# Patient Record
Sex: Male | Born: 1965 | Race: White | Hispanic: Yes | Marital: Single | Smoking: Never smoker
Health system: Southern US, Community
[De-identification: ages and names within clinical notes are randomized; demographics above are authoritative.]

## PROBLEM LIST (undated history)

## (undated) DIAGNOSIS — K5790 Diverticulosis of intestine, part unspecified, without perforation or abscess without bleeding: Secondary | ICD-10-CM

## (undated) DIAGNOSIS — K469 Unspecified abdominal hernia without obstruction or gangrene: Secondary | ICD-10-CM

## (undated) DIAGNOSIS — K219 Gastro-esophageal reflux disease without esophagitis: Secondary | ICD-10-CM

## (undated) DIAGNOSIS — E7800 Pure hypercholesterolemia, unspecified: Secondary | ICD-10-CM

## (undated) DIAGNOSIS — M1990 Unspecified osteoarthritis, unspecified site: Secondary | ICD-10-CM

## (undated) DIAGNOSIS — E785 Hyperlipidemia, unspecified: Secondary | ICD-10-CM

## (undated) HISTORY — DX: Diverticulosis of intestine, part unspecified, without perforation or abscess without bleeding: K57.90

## (undated) HISTORY — DX: Gastro-esophageal reflux disease without esophagitis: K21.9

## (undated) HISTORY — DX: Unspecified abdominal hernia without obstruction or gangrene: K46.9

## (undated) HISTORY — DX: Hyperlipidemia, unspecified: E78.5

## (undated) HISTORY — DX: Pure hypercholesterolemia, unspecified: E78.00

## (undated) HISTORY — DX: Unspecified osteoarthritis, unspecified site: M19.90

## (undated) HISTORY — PX: CHOLECYSTECTOMY: SHX55

## (undated) HISTORY — PX: HERNIA REPAIR: SHX51

---

## 2007-09-03 ENCOUNTER — Encounter (INDEPENDENT_AMBULATORY_CARE_PROVIDER_SITE_OTHER): Payer: Self-pay | Admitting: Surgery

## 2007-09-03 ENCOUNTER — Ambulatory Visit (HOSPITAL_COMMUNITY): Admission: RE | Admit: 2007-09-03 | Discharge: 2007-09-03 | Payer: Self-pay | Admitting: Surgery

## 2010-01-20 ENCOUNTER — Emergency Department (HOSPITAL_COMMUNITY): Admission: EM | Admit: 2010-01-20 | Discharge: 2010-01-20 | Payer: Self-pay | Admitting: Family Medicine

## 2010-12-29 LAB — POCT URINALYSIS DIP (DEVICE)
Bilirubin Urine: NEGATIVE
Glucose, UA: NEGATIVE mg/dL
Protein, ur: NEGATIVE mg/dL
Specific Gravity, Urine: 1.01 (ref 1.005–1.030)
pH: 6 (ref 5.0–8.0)

## 2011-02-22 NOTE — Op Note (Signed)
NAMEOAKLYN, MANS            ACCOUNT NO.:  [id number]   MEDICAL RECORD NO.:  [id number]          PATIENT TYPE:  AMB   LOCATION:  SDS                          FACILITY:  [facility]   PHYSICIAN:  Wilmon Arms. Corliss Skains, M.D. DATE OF BIRTH:  Feb 12, 1966   DATE OF PROCEDURE:  09/03/2007  DATE OF DISCHARGE:                               OPERATIVE REPORT   PREOPERATIVE DIAGNOSIS:  Chronic cholecystitis.   POSTOPERATIVE DIAGNOSIS:  Chronic cholecystitis.   PROCEDURE PERFORMED:  Laparoscopic cholecystectomy with intraoperative  cholangiogram.   SURGEON:  Wilmon Arms. Corliss Skains, M.D., FACS   ANESTHESIA:  General endotracheal.   INDICATIONS:  The patient is a 45 year old male who presents with a one-  year history of right upper quadrant pain radiating around to his back.  This is associated with nausea and bloating.  The patient has had  symptoms in the past and had a HIDA scan from October 2006.  This showed  a gallbladder ejection fraction measuring only 8%.  It does not appear  that he has ever had an ultrasound to evaluate his gallbladder for  stones.  Apparently he was previously evaluated in [facility] for  possible cholecystectomy but it is unclear why this never happened.  He  now presents for elective cholecystectomy.   DESCRIPTION OF PROCEDURE:  The patient is brought to the operating room  and placed in the supine position on the operating room table.  After an  adequate level of general anesthesia was obtained, the patient's abdomen  was prepped with Betadine and draped in sterile fashion.  Time-out was  taken to assure the proper patient and proper procedure.  The area  around his umbilicus was infiltrated with 0.25% Marcaine.  The patient  has a previous umbilical hernia repair and it is unclear whether any  mesh was involved.  We made a transverse incision above his umbilicus.  Dissection was carried down to the fascia which was opened vertically.  A stay suture of 0 Vicryl as  placed around the fascial opening.  The  peritoneal cava was bluntly entered.  The Hasson cannula was inserted  and secured with the stay suture.  Pneumoperitoneum is obtained by  insufflating CO2 maintaining maximal pressure of 15 mmHg.  The patient  was tilted in reverse Trendelenburg and tilted to his left.  A 10-mm  port was placed in the subxiphoid position.  Two 5 mm ports were placed  in right upper quadrant.  We grasped the gallbladder fundus with a clamp  and elevated it.  The patient had dense adhesions to the surface of the  gallbladder.  Cautery and sharp dissection was used to dissect this away  from the surface of the gallbladder.  We were able to expose a very thin  cystic duct.  This was ligated with a clip distally.  A small opening  was created on the cystic duct.  A Cook cholangiogram catheter was then  inserted through a stab incision and threaded in the cystic duct.  This  was secured with a clip.  A cholangiogram showed a very long tiny cystic  duct.  Contrast flowed  easily into a normal caliber common bile duct  with good flow proximally and distally.  Contrast flowed easily into the  duodenum.  The catheter was then removed and the cystic duct was ligated  with clipped and divided.  A small cystic artery was also ligated with  clips and divided.  Cautery was used to dissect the gallbladder free  from the liver bed.  Some bile was spilled but no stones were ever  noted.  We then thoroughly irrigated the right upper quadrant with  saline.  The gallbladder was placed in an EndoCatch sac.  Hemostasis was  obtained in the gallbladder fossa with cautery.  Two pieces of Surgicel  were placed in the gallbladder fossa.  We suctioned out as much  irrigation as possible.  The gallbladder was then removed from the  umbilical port site.  Pneumoperitoneum was released as the ports were  removed.  The stay  sutures used to close the umbilical port site.  4-0 Monocryl was used to   close skin incisions.  Steri-Strips and clean dressings were applied.  The patient was then extubated and brought to recovery in stable  condition.  All sponge, instrument, and needle counts were correct.      Wilmon Arms. Tsuei, M.D.  Electronically Signed     MKT/MEDQ  D:  09/03/2007  T:  09/03/2007  Job:  [id number]

## 2011-07-19 LAB — CBC
HCT: 44.6
MCHC: 34.7
MCV: 89.5

## 2011-07-19 LAB — DIFFERENTIAL
Basophils Absolute: 0.1
Eosinophils Absolute: 0.2
Eosinophils Relative: 3
Lymphs Abs: 2.2
Monocytes Absolute: 0.6
Monocytes Relative: 8
Neutro Abs: 4.2
Neutrophils Relative %: 58

## 2011-07-19 LAB — LIPID PANEL
Cholesterol: 225 — ABNORMAL HIGH
HDL: 36 — ABNORMAL LOW
LDL Cholesterol: 124 — ABNORMAL HIGH
Total CHOL/HDL Ratio: 6.3
Triglycerides: 323 — ABNORMAL HIGH
VLDL: 65 — ABNORMAL HIGH

## 2011-07-19 LAB — COMPREHENSIVE METABOLIC PANEL
AST: 29
GFR calc Af Amer: >60
GFR calc non Af Amer: >60
Glucose, Bld: 86
Potassium: 4.3
Sodium: 140
Total Bilirubin: 0.7

## 2013-01-09 ENCOUNTER — Encounter: Payer: Self-pay | Admitting: Internal Medicine

## 2013-01-11 ENCOUNTER — Encounter: Payer: Self-pay | Admitting: Internal Medicine

## 2013-01-11 ENCOUNTER — Ambulatory Visit (INDEPENDENT_AMBULATORY_CARE_PROVIDER_SITE_OTHER): Payer: BC Managed Care – PPO | Admitting: Internal Medicine

## 2013-01-11 VITALS — BP 102/78 | HR 68 | Ht 69.0 in | Wt 197.0 lb

## 2013-01-11 DIAGNOSIS — R1013 Epigastric pain: Secondary | ICD-10-CM

## 2013-01-11 DIAGNOSIS — K219 Gastro-esophageal reflux disease without esophagitis: Secondary | ICD-10-CM

## 2013-01-11 DIAGNOSIS — R110 Nausea: Secondary | ICD-10-CM

## 2013-01-11 DIAGNOSIS — K921 Melena: Secondary | ICD-10-CM

## 2013-01-11 DIAGNOSIS — K3189 Other diseases of stomach and duodenum: Secondary | ICD-10-CM

## 2013-01-11 MED ORDER — ESOMEPRAZOLE MAGNESIUM 40 MG PO CPDR: 40.0000 mg | DELAYED_RELEASE_CAPSULE | Freq: Every day | ORAL | Status: DC

## 2013-01-11 MED ORDER — PEG-KCL-NACL-NASULF-NA ASC-C 100 G PO SOLR: 1.0000 | Freq: Once | ORAL | Status: DC

## 2013-01-11 MED ORDER — ONDANSETRON 4 MG PO TBDP: 4.0000 mg | ORAL_TABLET | Freq: Three times a day (TID) | ORAL | Status: DC | PRN

## 2013-01-11 NOTE — Patient Instructions (Addendum)
You have been scheduled for a colonoscopy/endoscopy with propofol. Please follow written instructions given to you at your visit today.  Please pick up your prep kit at the pharmacy within the next 1-3 days. If you use inhalers (even only as needed), please bring them with you on the day of your procedure.   We have sent the following medications to your pharmacy for you to pick up at your convenience: moviprep, you were given instructions today at your office visit.  Zofran, nexium                                               We are excited to introduce MyChart, a new best-in-class service that provides you online access to important information in your electronic medical record. We want to make it easier for you to view your health information - all in one secure location - when and where you need it. We expect MyChart will enhance the quality of care and service we provide.  When you register for MyChart, you can:    View your test results.    Request appointments and receive appointment reminders via email.    Request medication renewals.    View your medical history, allergies, medications and immunizations.    Communicate with your physician's office through a password-protected site.    Conveniently print information such as your medication lists.  To find out if MyChart is right for you, please talk to a member of our clinical staff today. We will gladly answer your questions about this free health and wellness tool.  If you are age 25 or older and want a member of your family to have access to your record, you must provide written consent by completing a proxy form available at our office. Please speak to our clinical staff about guidelines regarding accounts for patients younger than age 32.  As you activate your MyChart account and need any technical assistance, please call the MyChart technical support line at ([phone]-CHART [phone]) or email your question to  [email or web address]. If you email your question(s), please include your name, a return phone number and the best time to reach you.  If you have non-urgent health-related questions, you can send a message to our office through MyChart at [email or web address]. If you have a medical emergency, call 911.  Thank you for using MyChart as your new health and wellness resource!   MyChart licensed from [employer],  4540-9811. Patents Pending.

## 2013-01-11 NOTE — Progress Notes (Signed)
Patient ID: Jahlon Baines, male   DOB: 11/23/1965, 47 y.o.   MRN: [id number] HPI: Mr. Peine is a 47 yo male with PMH of long-standing GERD, hyperlipidemia who is seen in consultation at the request of Dr. Andi Devon for evaluation of upper abd pain, GERD.  He is accompanied today by medical interpreter. He reports long-standing, approximately 10 year history of gastroesophageal reflux disease. This is manifested by heartburn and he has been on multiple PPIs in the past. He is currently taking omeprazole 20 mg once daily. He was previously on Nexium 40 mg daily and this works better for him, but he was told he needs to complete his current prescription before starting Nexium which was written by his PCP. His symptoms recently seem to have been worsening including early morning nausea and occasional vomiting. He continues to have regurgitation and reflux of acid into his mouth with a sour taste. He reports decreased appetite and occasional right upper quadrant abdominal pain. Occasionally the pain seems to radiate to his back. He reports significant abdominal bloating and fullness. Bowel habits have changed slightly he is now having 2-3 stools per day which feel incomplete. Previously he was having one formed bowel movement with the feeling of complete evacuation. He has seen some red blood in his stool of late. He also describes black stools though not currently. He denies fevers or chills. No weight loss. Positive early satiety. No previous history of known H. Pylori.  No prior endoscopy.  He has had prior cholecystectomy  Past Medical History  Diagnosis Date  . GERD (gastroesophageal reflux disease)   . Hyperlipidemia   . Arthritis   . Hernia     Past Surgical History  Procedure Laterality Date  . Hernia repair    . Cholecystectomy      Current Outpatient Prescriptions  Medication Sig Dispense Refill  . esomeprazole (NEXIUM) 40 MG capsule Take 1 capsule (40 mg total) by mouth daily before  breakfast.  30 capsule  6  . ondansetron (ZOFRAN ODT) 4 MG disintegrating tablet Take 1 tablet (4 mg total) by mouth every 8 (eight) hours as needed for nausea.  20 tablet  0  . peg 3350 powder (MOVIPREP) 100 G SOLR Take 1 kit (100 g total) by mouth once.  1 kit  0   No current facility-administered medications for this visit.    No Known Allergies  Family History  Problem Relation Age of Onset  . Diabetes Maternal Aunt   . Diabetes Maternal Uncle   . Heart disease Paternal Aunt   . Liver cancer Paternal Grandfather   . Pancreatic cancer Paternal Grandmother   . Kidney disease Father   . Irritable bowel syndrome Brother   . Clotting disorder Maternal Grandmother     History   Social History  . Marital Status: Single    Spouse Name: N/A    Number of Children: 0  . Years of Education: N/A   Occupational History  .     Social History Main Topics  . Smoking status: Never Smoker   . Smokeless tobacco: Never Used  . Alcohol Use: Yes     Comment: 2 drinks per day  . Drug Use: No  . Sexually Active: None   Other Topics Concern  . None   Social History Narrative  . None    ROS: As per history of present illness, otherwise negative  BP 102/78  Pulse 68  Ht 5\' 9"  (1.753 m)  Wt 197 lb (89.359 kg)  BMI 29.08 kg/m2 Constitutional: Well-developed and well-nourished. No distress. HEENT: Normocephalic and atraumatic. Oropharynx is clear and moist. No oropharyngeal exudate. Conjunctivae are normal.  No scleral icterus. Neck: Neck supple. Trachea midline. Cardiovascular: Normal rate, regular rhythm and intact distal pulses. No M/R/G Pulmonary/chest: Effort normal and breath sounds normal. No wheezing, rales or rhonchi. Abdominal: Soft, nontender, nondistended. Bowel sounds active throughout. There are no masses palpable. No hepatosplenomegaly. Extremities: no clubbing, cyanosis, or edema Lymphadenopathy: No cervical adenopathy noted. Neurological: Alert and oriented to  person place and time. Skin: Skin is warm and dry. No rashes noted. Psychiatric: Normal mood and affect. Behavior is normal.  RELEVANT LABS AND IMAGING: Labs dated 12/25/2012 H. pylori IgG negative  Labs dated 12/01/2012 WBC 6.7, hemoglobin 16.7, hematocrit 50.2, MCV 92.6, platelet 238 CMP within normal limits  ASSESSMENT/PLAN: 47 yo male with PMH of long-standing GERD, hyperlipidemia who is seen in consultation at the request of Dr. Andi Devon for evaluation of upper abd pain, GERD.  1.  Dyspepsia/GERD/nausea -- his upper GI symptoms have not completely respond to PPI and therefore I recommended upper endoscopy. This will help rule out as of esophagitis, and also peptic ulcer disease. We can plan biopsies for H. pylori given the decreased sensitivity of stool antigen and breath test on PPI.  I will have him switch to Nexium 40 mg daily now given it worked better for him. He is instructed to take this at least 30 minutes before breakfast. I will prescribe ondansetron to be used as needed and as directed for nausea.  2.  Red blood per rectum -- given his change in bowel habits and red blood in stool, we will perform colonoscopy at the same time as his upper endoscopy. We discussed the test today including the risks and benefits and he is agreeable to proceed.  Her the recommendations after procedure.

## 2013-03-01 ENCOUNTER — Ambulatory Visit (AMBULATORY_SURGERY_CENTER): Payer: BC Managed Care – PPO | Admitting: Internal Medicine

## 2013-03-01 ENCOUNTER — Encounter: Payer: Self-pay | Admitting: Internal Medicine

## 2013-03-01 VITALS — BP 131/68 | HR 50 | Temp 97.9°F | Resp 19 | Ht 69.0 in | Wt 197.0 lb

## 2013-03-01 DIAGNOSIS — D126 Benign neoplasm of colon, unspecified: Secondary | ICD-10-CM

## 2013-03-01 DIAGNOSIS — R1013 Epigastric pain: Secondary | ICD-10-CM

## 2013-03-01 DIAGNOSIS — K2970 Gastritis, unspecified, without bleeding: Secondary | ICD-10-CM

## 2013-03-01 DIAGNOSIS — K219 Gastro-esophageal reflux disease without esophagitis: Secondary | ICD-10-CM

## 2013-03-01 DIAGNOSIS — K2990 Gastroduodenitis, unspecified, without bleeding: Secondary | ICD-10-CM

## 2013-03-01 DIAGNOSIS — K921 Melena: Secondary | ICD-10-CM

## 2013-03-01 MED ORDER — SODIUM CHLORIDE 0.9 % IV SOLN: 500.0000 mL | INTRAVENOUS | Status: DC

## 2013-03-01 NOTE — Patient Instructions (Addendum)

## 2013-03-01 NOTE — Progress Notes (Signed)
Pt. With interpreter and pt. Spouse throughtout recovery period.  Instructions given in Spanish and in English with interpreterer going over material with pt.FOLLOW DISCHARGE INSTRUCTIONS   Patient did not experience any of the following events: a burn prior to discharge; a fall within the facility; wrong site/side/patient/procedure/implant event; or a hospital transfer or hospital admission upon discharge from the facility. (G8907)Patient did not have preoperative order for IV antibiotic SSI prophylaxis. [phone]).

## 2013-03-01 NOTE — Op Note (Signed)
[facility] [address] [place], [address]   ENDOSCOPY PROCEDURE REPORT  Dean: Yazid, Pop  MR#: [id number] BIRTHDATE: 24-Aug-1966 , 46  yrs. old GENDER: Male ENDOSCOPIST: Beverley Fiedler, MD REFERRED BY:  Gabriel Earing, M.D. PROCEDURE DATE:  03/01/2013 PROCEDURE:  EGD w/ biopsy ASA CLASS:     Class II INDICATIONS:  Dyspepsia.   Nausea.   history of esophageal reflux. MEDICATIONS: MAC sedation, administered by CRNA and propofol (Diprivan) 300mg  IV TOPICAL ANESTHETIC: Cetacaine Spray  DESCRIPTION OF PROCEDURE: After James risks benefits and alternatives of James procedure were thoroughly explained, informed consent was obtained.  James LB [id number] endoscope was introduced through James mouth and advanced to James second portion of James duodenum. Without limitations.  James instrument was slowly withdrawn as James mucosa was fully examined.    ESOPHAGUS: An irregular Z-line was observed 39 cm from James incisors. Multiple biopsies were performed using cold forceps.  Sample obtained to rule out Barrett's esophagus.   James esophagus was otherwise normal.  STOMACH: James mucosa of James stomach appeared normal.  Biopsies were taken in James antrum and angularis.  DUODENUM: James duodenal mucosa showed no abnormalities in James bulb and second portion of James duodenum.  Retroflexed views revealed no abnormalities.     James scope was then withdrawn from James Dean and James procedure completed.  COMPLICATIONS: There were no complications. ENDOSCOPIC IMPRESSION: 1.   Irregular Z-line was observed 39 cm from James incisors; multiple biopsies 2.   James esophagus was otherwise normal. 3.   James mucosa of James stomach appeared normal; biopsies were taken in James antrum and angularis 4.   James duodenal mucosa showed no abnormalities in James bulb and second portion of James duodenum  RECOMMENDATIONS: 1.  Await biopsy results 2.  Continue taking your PPI (Nexium) once daily.  It is best  to be taken 20-30 minutes prior to breakfast meal. 3.  Follow-up of helicobacter pylori status, treat if indicated 4.  Office follow-up in about 1 month  eSigned:  Beverley Fiedler, MD 03/01/2013 2:18 PM CC:James Dean

## 2013-03-01 NOTE — Progress Notes (Signed)
Called to room to assist during endoscopic procedure.  Patient ID and intended procedure confirmed with present staff. Received instructions for my participation in the procedure from the performing physician.  

## 2013-03-01 NOTE — Op Note (Signed)
[facility] [address] [place], [address]   COLONOSCOPY PROCEDURE REPORT  PATIENT: James Dean, James Dean  MR#: [id number] BIRTHDATE: 12/25/1965 , 46  yrs. old GENDER: Male ENDOSCOPIST: Beverley Fiedler, MD REFERRED XL:KGMWN Cloward, M.D. PROCEDURE DATE:  03/01/2013 PROCEDURE:   Colonoscopy with snare polypectomy ASA CLASS:   Class II INDICATIONS:Rectal Bleeding. MEDICATIONS: MAC sedation, administered by CRNA and propofol (Diprivan) 200mg  IV  DESCRIPTION OF PROCEDURE:   After the risks benefits and alternatives of the procedure were thoroughly explained, informed consent was obtained.  A digital rectal exam revealed external hemorrhoids.   The LB [id number]  endoscope was introduced through the anus and advanced to the cecum, which was identified by both the appendix and ileocecal valve. No adverse events experienced.   The quality of the prep was good, using MoviPrep The instrument was then slowly withdrawn as the colon was fully examined.  COLON FINDINGS: Two sessile polyps measuring 3 mm and 6 mm mm in size were found in the ascending colon.  Polypectomy was performed using cold snare.  All resections were complete and all polyp tissue was completely retrieved.   Mild diverticulosis was noted in the descending colon and sigmoid colon.  Retroflexed views revealed small internal and external hemorrhoids. The time to cecum=1 minutes 39 seconds.  Withdrawal time=11 minutes 49 seconds.  The scope was withdrawn and the procedure completed. COMPLICATIONS: There were no complications.  ENDOSCOPIC IMPRESSION: 1.   Two sessile polyps measuring 3 mm and 6 mm mm in size were found in the ascending colon; Polypectomy was performed using cold snare 2.   Mild diverticulosis was noted in the descending colon and sigmoid colon 3.   Small internal and external hemorrhoids  RECOMMENDATIONS: 1.  Hold aspirin, aspirin products, and anti-inflammatory medication for 1  week. 2.  Await pathology results 3.  High fiber diet 4.  If the polyps removed today are proven to be adenomatous (pre-cancerous) polyps, you will need a repeat colonoscopy in 5 years.  Otherwise you should continue to follow colorectal cancer screening guidelines for "routine risk" patients with colonoscopy in 10 years.  You will receive a letter within 1-2 weeks with the results of your biopsy as well as final recommendations.  Please call my office if you have not received a letter after 3 weeks.   eSigned:  Beverley Fiedler, MD 03/01/2013 2:22 PM            cc: The Patient

## 2013-03-05 ENCOUNTER — Telehealth: Payer: Self-pay | Admitting: *Deleted

## 2013-03-05 NOTE — Telephone Encounter (Signed)
Number identifier, left message, follow-up  

## 2013-03-07 ENCOUNTER — Encounter: Payer: Self-pay | Admitting: Internal Medicine

## 2013-03-07 ENCOUNTER — Other Ambulatory Visit: Payer: Self-pay | Admitting: *Deleted

## 2013-03-26 ENCOUNTER — Encounter: Payer: Self-pay | Admitting: Internal Medicine

## 2013-03-29 ENCOUNTER — Ambulatory Visit (INDEPENDENT_AMBULATORY_CARE_PROVIDER_SITE_OTHER): Payer: BC Managed Care – PPO | Admitting: Internal Medicine

## 2013-03-29 ENCOUNTER — Encounter: Payer: Self-pay | Admitting: Internal Medicine

## 2013-03-29 VITALS — BP 122/70 | HR 80 | Ht 68.0 in | Wt 201.5 lb

## 2013-03-29 DIAGNOSIS — K921 Melena: Secondary | ICD-10-CM

## 2013-03-29 DIAGNOSIS — R1013 Epigastric pain: Secondary | ICD-10-CM

## 2013-03-29 DIAGNOSIS — K648 Other hemorrhoids: Secondary | ICD-10-CM

## 2013-03-29 DIAGNOSIS — Z860101 Personal history of adenomatous and serrated colon polyps: Secondary | ICD-10-CM | POA: Insufficient documentation

## 2013-03-29 DIAGNOSIS — K219 Gastro-esophageal reflux disease without esophagitis: Secondary | ICD-10-CM

## 2013-03-29 DIAGNOSIS — K649 Unspecified hemorrhoids: Secondary | ICD-10-CM

## 2013-03-29 DIAGNOSIS — Z86010 Personal history of colonic polyps: Secondary | ICD-10-CM

## 2013-03-29 MED ORDER — RESTORA PO CAPS: 1.0000 | ORAL_CAPSULE | Freq: Every day | ORAL | Status: DC

## 2013-03-29 MED ORDER — ESOMEPRAZOLE MAGNESIUM 40 MG PO CPDR: 40.0000 mg | DELAYED_RELEASE_CAPSULE | Freq: Every day | ORAL | Status: DC

## 2013-03-29 MED ORDER — HYDROCORTISONE ACETATE 25 MG RE SUPP: 25.0000 mg | RECTAL | Status: DC | PRN

## 2013-03-29 NOTE — Patient Instructions (Addendum)
We have given you samples of Restora. This puts good bacteria back into your intestines. You should take 1 capsule by mouth once daily. If this works well for you, it can be purchased over the counter.or online  Continue taking Nexium  We have sent the following medications to your pharmacy for you to pick up at your convenience: [facility]; use as needed for Internal Hemorrhoids                                               We are excited to introduce MyChart, a new best-in-class service that provides you online access to important information in your electronic medical record. We want to make it easier for you to view your health information - all in one secure location - when and where you need it. We expect MyChart will enhance the quality of care and service we provide.  When you register for MyChart, you can:    View your test results.    Request appointments and receive appointment reminders via email.    Request medication renewals.    View your medical history, allergies, medications and immunizations.    Communicate with your physician's office through a password-protected site.    Conveniently print information such as your medication lists.  To find out if MyChart is right for you, please talk to a member of our clinical staff today. We will gladly answer your questions about this free health and wellness tool.  If you are age 50 or older and want a member of your family to have access to your record, you must provide written consent by completing a proxy form available at our office. Please speak to our clinical staff about guidelines regarding accounts for patients younger than age 73.  As you activate your MyChart account and need any technical assistance, please call the MyChart technical support line at [phone]) or email your question to [email or web address]. If you email your question(s), please include your name, a return phone number and the  best time to reach you.  If you have non-urgent health-related questions, you can send a message to our office through MyChart at [email or web address]. If you have a medical emergency, call 911.  Thank you for using MyChart as your new health and wellness resource!   MyChart licensed from [employer],  4540-9811. Patents Pending.

## 2013-03-29 NOTE — Progress Notes (Signed)
Subjective:    Patient ID: James Dean, male    DOB: 10/05/1966, 47 y.o.   MRN: [id number]  HPI James Dean is a 47 yo male with PMH of long-standing GERD, hyperlipidemia who is seen in followup. He had an upper endoscopy performed for dyspepsia nausea and history of reflux on 03/01/2013. This showed an irregular Z line which was biopsied, otherwise normal esophagus, normal stomach, normal duodenum. Biopsies from the stomach showed slight chronic gastritis without H. pylori, dysplasia or malignancy. GE junction biopsies were consistent with gastroesophageal reflux without intestinal metaplasia,  Dysplasia or malignancy. Colonoscopy performed on the same day for rectal bleeding revealed 2 sessile polyps in the ascending colon, mild left-sided diverticulosis and small internal and external hemorrhoids. The polyps were found to be adenomatous without high-grade dysplasia.  After his last visit his PPI was changed from omeprazole to Nexium and he is here for followup. He reports he is feeling better. His GERD significantly improved with the change to Nexium. He is still having occasional abdominal bloating and gas. This is postprandial in nature. He reports he is eating well without nausea or vomiting. Bowel habits have changed slightly since colonoscopy and his stools are slightly more loose. No melena. He does report occasional bright red blood on the toilet tissue. He also reports occasional upper abdominal discomfort after using a piece of heavy machinery at work which vibrates his whole body. Outside of using this machine he has no abdominal pain.  Review of Systems As per history of present illness, otherwise negative  Current Medications, Allergies, Past Medical History, Past Surgical History, Family History and Social History were reviewed in [employer] record.     Objective:   Physical Exam BP 122/70  Pulse 80  Ht 5\' 8"  (1.727 m)  Wt 201 lb 8 oz (91.4 kg)  BMI  30.65 kg/m2 Constitutional: Well-developed and well-nourished. No distress. HEENT: Normocephalic and atraumatic. Oropharynx is clear and moist. No oropharyngeal exudate. Conjunctivae are normal.  No scleral icterus. Cardiovascular: Normal rate, regular rhythm and intact distal pulses. No M/R/G Pulmonary/chest: Effort normal and breath sounds normal. No wheezing, rales or rhonchi. Abdominal: Soft, nontender, nondistended. Bowel sounds active throughout. There are no masses palpable. No hepatosplenomegaly. Extremities: no clubbing, cyanosis, or edema Neurological: Alert and oriented to person place and time. Skin: Skin is warm and dry. No rashes noted. Psychiatric: Normal mood and affect. Behavior is normal.    Assessment & Plan:  47 yo male with PMH of long-standing GERD, hyperlipidemia who is seen in followup. He had an upper endoscopy performed for dyspepsia nausea and history of reflux on 03/01/2013.   1.  GERD/dyspepsia -- better controlled with changing PPI to Nexium. He will continue Nexium 40 mg daily. Upper endoscopy reassuring with no evidence of Barrett's or H. pylori. A GERD diet was recommended.  2.  Abdominal bloating -- I recommended a probiotic and he was given samples of Restora to help restore balance and hopefully provide regulation indigestion. If this fails to improve his symptoms he is asked to notify my office. She voices understanding.  3.  Hemorrhoids -- he is having some small volume rectal bleeding which is felt to be secondary to hemorrhoids he recently colonoscopy. I recommended and prescribed Anusol-HC suppositories to be used as needed as directed for such bleeding. If this fails to improve his symptoms or the bleeding becomes more voluminous he is asked to let us know. He voices understanding  4.  History of adenomatous colon  polyps -- 2 subcentimeter adenomas removed recommend repeat in 5 years. He is aware of this recommendation

## 2015-08-19 ENCOUNTER — Ambulatory Visit: Payer: Self-pay | Admitting: Family Medicine

## 2015-11-02 ENCOUNTER — Ambulatory Visit
Admission: RE | Admit: 2015-11-02 | Discharge: 2015-11-02 | Disposition: A | Discharge status: Home / Self Care | Payer: BLUE CROSS/BLUE SHIELD | Source: Ambulatory Visit | Attending: Physician Assistant | Admitting: Physician Assistant

## 2015-11-02 ENCOUNTER — Other Ambulatory Visit: Payer: Self-pay | Admitting: Physician Assistant

## 2015-11-02 DIAGNOSIS — M549 Dorsalgia, unspecified: Secondary | ICD-10-CM

## 2015-11-02 DIAGNOSIS — M79605 Pain in left leg: Secondary | ICD-10-CM

## 2015-12-07 ENCOUNTER — Other Ambulatory Visit: Payer: Self-pay | Admitting: Family Medicine

## 2015-12-07 DIAGNOSIS — G5712 Meralgia paresthetica, left lower limb: Secondary | ICD-10-CM

## 2015-12-07 DIAGNOSIS — R42 Dizziness and giddiness: Secondary | ICD-10-CM

## 2015-12-07 DIAGNOSIS — R41840 Attention and concentration deficit: Secondary | ICD-10-CM

## 2015-12-14 ENCOUNTER — Ambulatory Visit
Admission: RE | Admit: 2015-12-14 | Discharge: 2015-12-14 | Disposition: A | Discharge status: Home / Self Care | Payer: BLUE CROSS/BLUE SHIELD | Source: Ambulatory Visit | Attending: Family Medicine | Admitting: Family Medicine

## 2015-12-14 DIAGNOSIS — G5712 Meralgia paresthetica, left lower limb: Secondary | ICD-10-CM

## 2015-12-14 DIAGNOSIS — R42 Dizziness and giddiness: Secondary | ICD-10-CM

## 2015-12-14 DIAGNOSIS — R41840 Attention and concentration deficit: Secondary | ICD-10-CM

## 2015-12-14 MED ORDER — IOPAMIDOL (ISOVUE-300) INJECTION 61%
75.0000 mL | Freq: Once | INTRAVENOUS | Status: AC | PRN
  Administered 2015-12-14: 75 mL via INTRAVENOUS

## 2016-03-11 ENCOUNTER — Ambulatory Visit (INDEPENDENT_AMBULATORY_CARE_PROVIDER_SITE_OTHER): Payer: BLUE CROSS/BLUE SHIELD | Admitting: Neurology

## 2016-03-11 ENCOUNTER — Encounter: Payer: Self-pay | Admitting: Neurology

## 2016-03-11 VITALS — BP 132/76 | HR 60 | Ht 68.0 in | Wt 186.0 lb

## 2016-03-11 DIAGNOSIS — R42 Dizziness and giddiness: Secondary | ICD-10-CM

## 2016-03-11 DIAGNOSIS — R202 Paresthesia of skin: Secondary | ICD-10-CM

## 2016-03-11 MED ORDER — ACETAZOLAMIDE 125 MG PO TABS: ORAL_TABLET | ORAL | Status: DC

## 2016-03-11 NOTE — Patient Instructions (Signed)
Vértigo  (Vertigo)   Vértigo es la sensación de que se está moviendo estando quieto. Puede ser peligroso si ocurre cuando está trabajado, conduciendo vehículos o realizando actividades difíciles.   CAUSAS   El vértigo se produce cuando hay un conflicto en las señales que se envían al cerebro desde los sistemas visual y sensorial del cuerpo. Hay numerosas causas que originan este problema, entre las que se incluyen:   · Infecciones, especialmente en el oído interno.  · Una mala reacción a un medicamento o mal uso de alcohol y fármacos.  · Abstinencia de drogas o alcohol.  · Cambios rápidos de posición, como al acostarse o darse vuelta en la cama.  · Dolor de cabeza migrañoso.  · Disminución del flujo sanguíneo hacia el cerebro.  · Aumento de la presión en el cerebro por un traumatismo, infección, tumor o sangrado en la cabeza.  SÍNTOMAS   Puede sentir como si el mundo da vueltas o va a caer al piso. Como hay problemas en el equilibrio, el vértigo puede causar náuseas y vómitos. Tiene movimientos oculares involuntarios (nistagmus).   DIAGNÓSTICO   El vértigo normalmente se diagnostica con un examen físico. Si la causa no se conoce, el médico puede indicar diagnóstico por imágenes, como una resonancia magnética (imágenes por resonancia magnética).   TRATAMIENTO   La mayor parte de los casos de vértigo se resuelve sin tratamiento. Según la causa, el médico podrá recetar ciertos medicamentos. Si se relaciona con la posición del cuerpo, podrá recomendarle movimientos o procedimientos para corregir el problema. En algunos casos raros, si la causa del vértigo es un problema en el oído interno, necesitará una cirugía.   INSTRUCCIONES PARA EL CUIDADO DOMICILIARIO  · Siga las indicaciones del médico.  · Evite conducir vehículos.  · Evite operar maquinarias pesadas.  · Evite realizar tareas que serían peligrosas para usted u otras personas durante un episodio de vértigo.  · Comuníquele al médico si nota que ciertos medicamentos  parecen asociarse con las crisis. Algunos medicamentos que se usan para tratar los episodios, en algunas personas los empeoran.  SOLICITE ATENCIÓN MÉDICA DE INMEDIATO SI:  · Los medicamentos no alivian las crisis o hacen que estas empeoren.  · Tiene dificultad para hablar, caminar, siente debilidad o tiene problemas para usar los brazos, las manos o las piernas.  · Comienza a sufrir un dolor de cabeza intenso.  · Las náuseas y los vómitos no se alivian o se agravan.  · Aparecen trastornos visuales.  · Un miembro de su familia nota cambios en su conducta.  · Hay alguna modificación en su trastorno que parece hacerlo empeorar en lugar de mejorar.  ASEGÚRESE DE QUE:   · Comprende estas instrucciones.  · Controlará su enfermedad.  · Solicitará ayuda de inmediato si no mejora o si empeora.     Esta información no tiene como fin reemplazar el consejo del médico. Asegúrese de hacerle al médico cualquier pregunta que tenga.     Document Released: 07/06/2005 Document Revised: 12/19/2011  Elsevier Interactive Patient Education ©2016 [employer]

## 2016-03-11 NOTE — Progress Notes (Signed)
Reason for visit: Vertigo  Referring physician: Dr. Tery Sanfilippo James Dean is a 50 y.o. male  History of present illness:  James Dean is a 50 year old right-handed Hispanic male with a several year history of episodes of dizziness or vertigo. The patient comes in with an interpreter today. The patient indicates that the episodes have become more frequent over time, he is now having 2 or 3 episodes a week. Initially, the episodes of dizziness would last only a few seconds, but now episodes may last 10-15 minutes. The episodes are associated with bilateral hearing loss, some discomfort in the back of the neck and head, and some confusion. The patient has difficulty with focusing during the events. The patient may occasionally become nauseated with the events, but with no vomiting. He does not have an overt headache associated with the episodes. The patient has persistent numbness in the distal anterolateral left thigh, he has had some numbness in the fifth finger on the left hand, but this has resolved. The patient does report some difficulty with memory that has been present for several years, gradually worsening over time. He denies any balance issues or difficulty controlling the bowels or the bladder. The episodes of dizziness may be initiated by a head movement such as bending or stooping, sitting up when he first gets up in the morning, or with standing. The patient has not had any blackouts. He has difficulty performing his job tasks during the dizzy events. The patient has undergone a CT scan of the head that was done, this appears to be normal. He comes to this office for further evaluation.  Past Medical History  Diagnosis Date  . GERD (gastroesophageal reflux disease)   . Hyperlipidemia   . Arthritis   . Hernia   . Diverticulosis   . High cholesterol     Past Surgical History  Procedure Laterality Date  . Hernia repair    . Cholecystectomy      Family History  Problem  Relation Age of Onset  . Diabetes Maternal Aunt   . Diabetes Maternal Uncle   . Heart disease Paternal Aunt   . Pancreatic cancer Paternal Grandmother   . Liver cancer Paternal Grandmother   . Kidney disease Father   . Irritable bowel syndrome Brother   . Clotting disorder Maternal Grandmother     Social history:  reports that he has never smoked. He has never used smokeless tobacco. He reports that he drinks alcohol. He reports that he does not use illicit drugs.  Medications:  Prior to Admission medications   Medication Sig Start Date End Date Taking? Authorizing Provider  esomeprazole (NEXIUM) 40 MG capsule Take 1 capsule (40 mg total) by mouth daily before breakfast. 03/29/13  Yes Jerene Bears, MD  loratadine (CLARITIN) 10 MG tablet Take 10 mg by mouth daily.   Yes Historical Provider, MD  hydrocortisone (ANUSOL-HC) 25 MG suppository Place 1 suppository (25 mg total) rectally as needed for hemorrhoids. Patient not taking: Reported on 03/11/2016 03/29/13   Jerene Bears, MD  ondansetron (ZOFRAN ODT) 4 MG disintegrating tablet Take 1 tablet (4 mg total) by mouth every 8 (eight) hours as needed for nausea. Patient not taking: Reported on 03/11/2016 01/11/13   Jerene Bears, MD     No Known Allergies  ROS:  Out of a complete 14 system review of symptoms, the patient complains only of the following symptoms, and all other reviewed systems are negative.  Fatigue Hearing loss Blurred vision,  loss of vision Constipation Urination problems Joint pain, muscle cramps Runny nose Memory loss  Blood pressure 132/76, pulse 60, height 5\' 8"  (1.727 m), weight 186 lb (84.369 kg).  Physical Exam  General: The patient is alert and cooperative at the time of the examination.  Eyes: Pupils are equal, round, and reactive to light. Discs are flat bilaterally.  Ears: Tympanic membranes are clear bilaterally.  Neck: The neck is supple, no carotid bruits are noted.  Respiratory: The respiratory  examination is clear.  Cardiovascular: The cardiovascular examination reveals a regular rate and rhythm, no obvious murmurs or rubs are noted.  Skin: Extremities are without significant edema.  Neurologic Exam  Mental status: The patient is alert and oriented x 3 at the time of the examination. The patient has apparent normal recent and remote memory, with an apparently normal attention span and concentration ability. Mini-Mental Status Examination done today shows a total score 23/30.  Cranial nerves: Facial symmetry is present. There is good sensation of the face to pinprick and soft touch bilaterally. The strength of the facial muscles and the muscles to head turning and shoulder shrug are normal bilaterally. Speech is well enunciated, no aphasia or dysarthria is noted. Extraocular movements are full. Visual fields are full. The tongue is midline, and the patient has symmetric elevation of the soft palate. No obvious hearing deficits are noted.  Motor: The motor testing reveals 5 over 5 strength of all 4 extremities. Good symmetric motor tone is noted throughout.  Sensory: Sensory testing is intact to pinprick, soft touch, vibration sensation, and position sense on all 4 extremities, with exception of a small area in the distal anterolateral left thigh with decreased pinprick sensation. No evidence of extinction is noted.  Coordination: Cerebellar testing reveals good finger-nose-finger and heel-to-shin bilaterally.  Gait and station: Gait is normal. Tandem gait is normal. Romberg is negative. No drift is seen.  Reflexes: Deep tendon reflexes are symmetric and normal bilaterally. Reflexes in the legs are slightly brisk. Toes are downgoing bilaterally.   CT head 12/14/15:  IMPRESSION: 1. Normal CT appearance of the brain without and with contrast. 2. Minimal ethmoid sinus disease.  * CT scan images were reviewed online. I agree with the written report.    Assessment/Plan:  1.  Episodic dizziness, hearing changes, neck discomfort  2. Memory disturbance  The clinical examination is relatively unremarkable. The patient reports episodes of dizziness, slight confusion, neck and posterior head discomfort, and hearing changes. The possibility of Mnire's disease needs to be considered, migraine headache needs to be considered as well. The patient will be evaluated for possible demyelinating disease with MRI of the brain. He will be placed on a trial of Diamox. He will follow-up in 3-4 months. He will contact me if he is not tolerating medication well.   Jill Alexanders MD 03/11/2016 2:53 PM  [facility] [address] [place], [place] [address]  Phone [phone] Fax [phone]

## 2016-03-23 ENCOUNTER — Ambulatory Visit: Payer: BLUE CROSS/BLUE SHIELD

## 2016-04-06 ENCOUNTER — Ambulatory Visit (INDEPENDENT_AMBULATORY_CARE_PROVIDER_SITE_OTHER): Payer: BLUE CROSS/BLUE SHIELD

## 2016-04-06 DIAGNOSIS — R42 Dizziness and giddiness: Secondary | ICD-10-CM

## 2016-04-06 DIAGNOSIS — R202 Paresthesia of skin: Secondary | ICD-10-CM

## 2016-04-07 MED ORDER — GADOPENTETATE DIMEGLUMINE 469.01 MG/ML IV SOLN: 18.0000 mL | Freq: Once | INTRAVENOUS | Status: AC | PRN

## 2016-04-08 ENCOUNTER — Telehealth: Payer: Self-pay | Admitting: Neurology

## 2016-04-08 NOTE — Telephone Encounter (Signed)
   MRI the brain is essentially normal, minimal white matter changes. Minimal fluid in the maxillary sinuses.  MRI brain 04/08/16:  IMPRESSION: This MRI of the brain with and without contrast shows the following: 1. There are a few punctate T2/FLAIR hyperintense foci in the subcortical white matter of both hemispheres. This is a nonspecific finding and could be idiopathic, due to multiple chronic microvascular ischemic change or due to migraine. The foci do not appear to be acute. 2. Fluid is noted within the maxillary sinuses consistent with mild acute maxillary sinusitis. 3. There are no acute findings in the brain.

## 2016-07-14 ENCOUNTER — Encounter: Payer: Self-pay | Admitting: Neurology

## 2016-07-14 ENCOUNTER — Ambulatory Visit (INDEPENDENT_AMBULATORY_CARE_PROVIDER_SITE_OTHER): Payer: BLUE CROSS/BLUE SHIELD | Admitting: Neurology

## 2016-07-14 VITALS — BP 124/83 | HR 67 | Resp 16 | Ht 68.0 in | Wt 186.0 lb

## 2016-07-14 DIAGNOSIS — R42 Dizziness and giddiness: Secondary | ICD-10-CM | POA: Diagnosis not present

## 2016-07-14 NOTE — Progress Notes (Signed)
Reason for visit: Vertigo  James Dean is an 50 y.o. male  History of present illness:  James Dean is a 50 year old right-handed Hispanic male with a history of dizziness and vertigo that have been present for several years. The patient was having 2 to 3 episodes each week of dizziness that may last up to 10-15 minutes associated with some discomfort in the back of the head and neck and some sensation of being spacey. MRI of the brain was done, this shows no significant abnormalities, the patient has minimal fluid in the maxillary sinuses bilaterally. The patient does report sinus drainage that is clear on a daily basis, most notable early in the morning. The patient has been placed on Diamox, he cannot tolerate the medication secondary to stomach upset. However, the dizziness and headache problems have gone away. He returns for an evaluation.  Past Medical History:  Diagnosis Date  . Arthritis   . Diverticulosis   . GERD (gastroesophageal reflux disease)   . Hernia   . High cholesterol   . Hyperlipidemia     Past Surgical History:  Procedure Laterality Date  . CHOLECYSTECTOMY    . HERNIA REPAIR      Family History  Problem Relation Age of Onset  . Kidney disease Father   . Diabetes Maternal Aunt   . Diabetes Maternal Uncle   . Heart disease Paternal Aunt   . Pancreatic cancer Paternal Grandmother   . Liver cancer Paternal Grandmother   . Irritable bowel syndrome Brother   . Clotting disorder Maternal Grandmother     Social history:  reports that he has never smoked. He has never used smokeless tobacco. He reports that he drinks alcohol. He reports that he does not use drugs.   No Known Allergies  Medications:  Prior to Admission medications   Not on File    ROS:  Out of a complete 14 system review of symptoms, the patient complains only of the following symptoms, and all other reviewed systems are negative.  Dizziness Sinus drainage  Blood pressure  124/83, pulse 67, resp. rate 16, height 5\' 8"  (1.727 m), weight 186 lb (84.4 kg).  Physical Exam  General: The patient is alert and cooperative at the time of the examination.  Skin: No significant peripheral edema is noted.   Neurologic Exam  Mental status: The patient is alert and oriented x 3 at the time of the examination. The patient has apparent normal recent and remote memory, with an apparently normal attention span and concentration ability.   Cranial nerves: Facial symmetry is present. Speech is normal, no aphasia or dysarthria is noted. Extraocular movements are full. Visual fields are full.  Motor: The patient has good strength in all 4 extremities.  Sensory examination: Soft touch sensation is symmetric on the face, arms, and legs.  Coordination: The patient has good finger-nose-finger and heel-to-shin bilaterally.  Gait and station: The patient has a normal gait. Tandem gait is normal. Romberg is negative. No drift is seen.  Reflexes: Deep tendon reflexes are symmetric.   MRI brain 04/08/16:  IMPRESSION: This MRI of the brain with and without contrast shows the following: 1. There are a few punctate T2/FLAIR hyperintense foci in the subcortical white matter of both hemispheres. This is a nonspecific finding and could be idiopathic, due to multiple chronic microvascular ischemic change or due to migraine. The foci do not appear to be acute. 2. Fluid is noted within the maxillary sinuses consistent with mild acute maxillary  sinusitis. 3. There are no acute findings in the brain.  * MRI scan images were reviewed online. I agree with the written report.     Assessment/Plan:  1. Episodic vertigo, headache, possible migraine  The patient may have allergy associated sinus drainage, there is no evidence of severe sinusitis by MRI. The patient is to try a course of Claritin over the next several weeks to see if this improves a sinus drainage. His headache  issues and vertigo have stopped. If these are related to migraine, they may recur down the road. A trial on Topamax may be warranted. He will follow-up if needed.  Jill Alexanders MD 07/14/2016 7:34 AM  [facility] [address] [place], [place] [address]  Phone [phone] Fax [phone]

## 2017-02-13 IMAGING — CR DG HIP (WITH OR WITHOUT PELVIS) 3-4V BILAT
4 series · 4 of 4 positions shown · non-contrast
Comparison: Abdominal and pelvic CT scan November 06, 2014

CLINICAL DATA: Low back and left leg pain for the past 2 months
without known injury

EXAM:
DG HIP (WITH OR WITHOUT PELVIS) 3-4V BILAT

[w pelvis (1 of 2)]
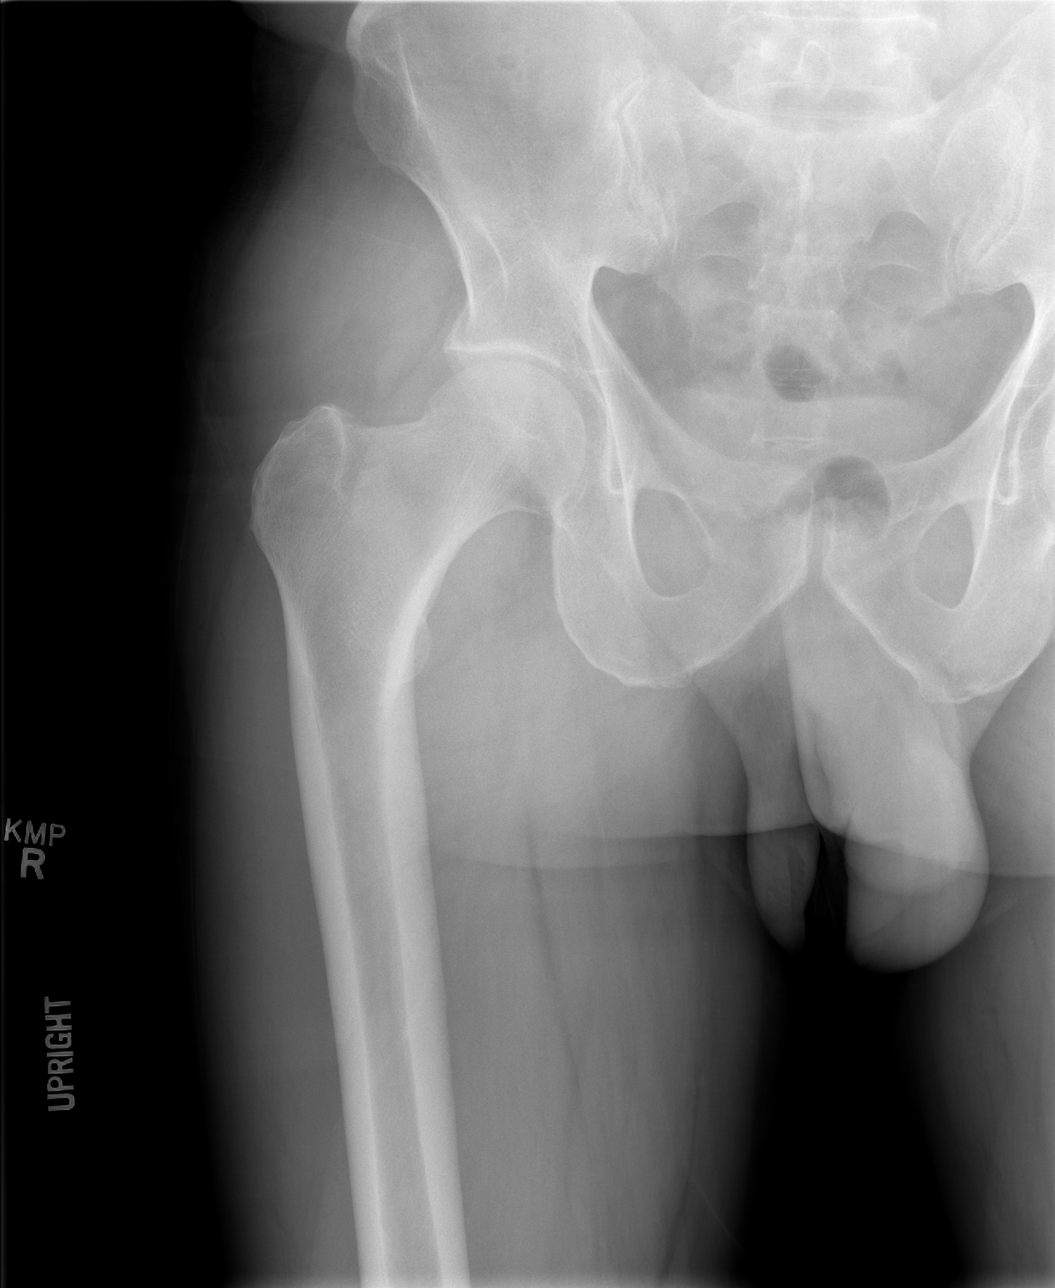

[w pelvis (2 of 2)]
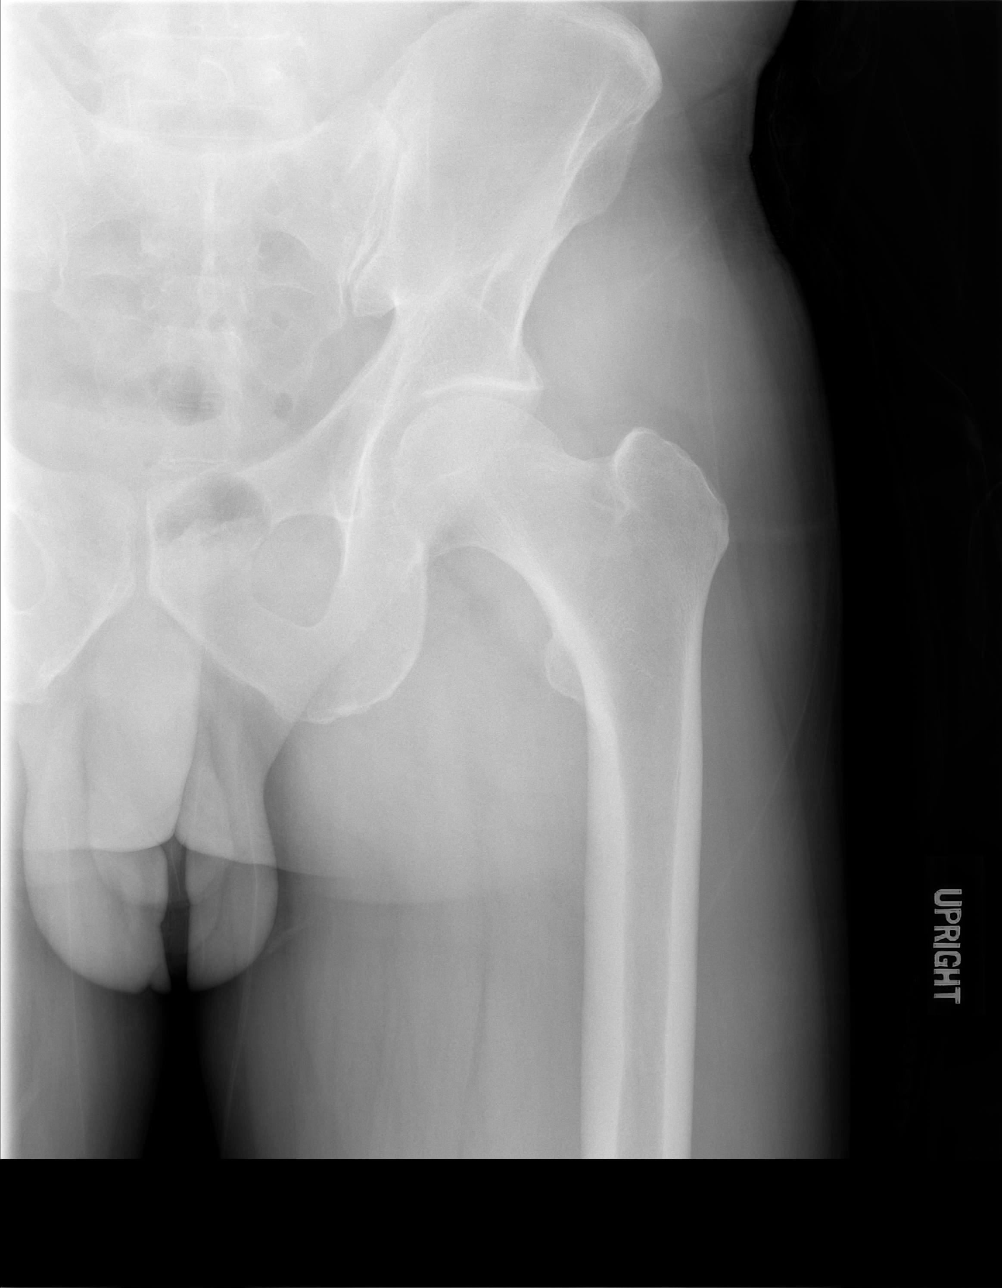

[t hip frog leg left]
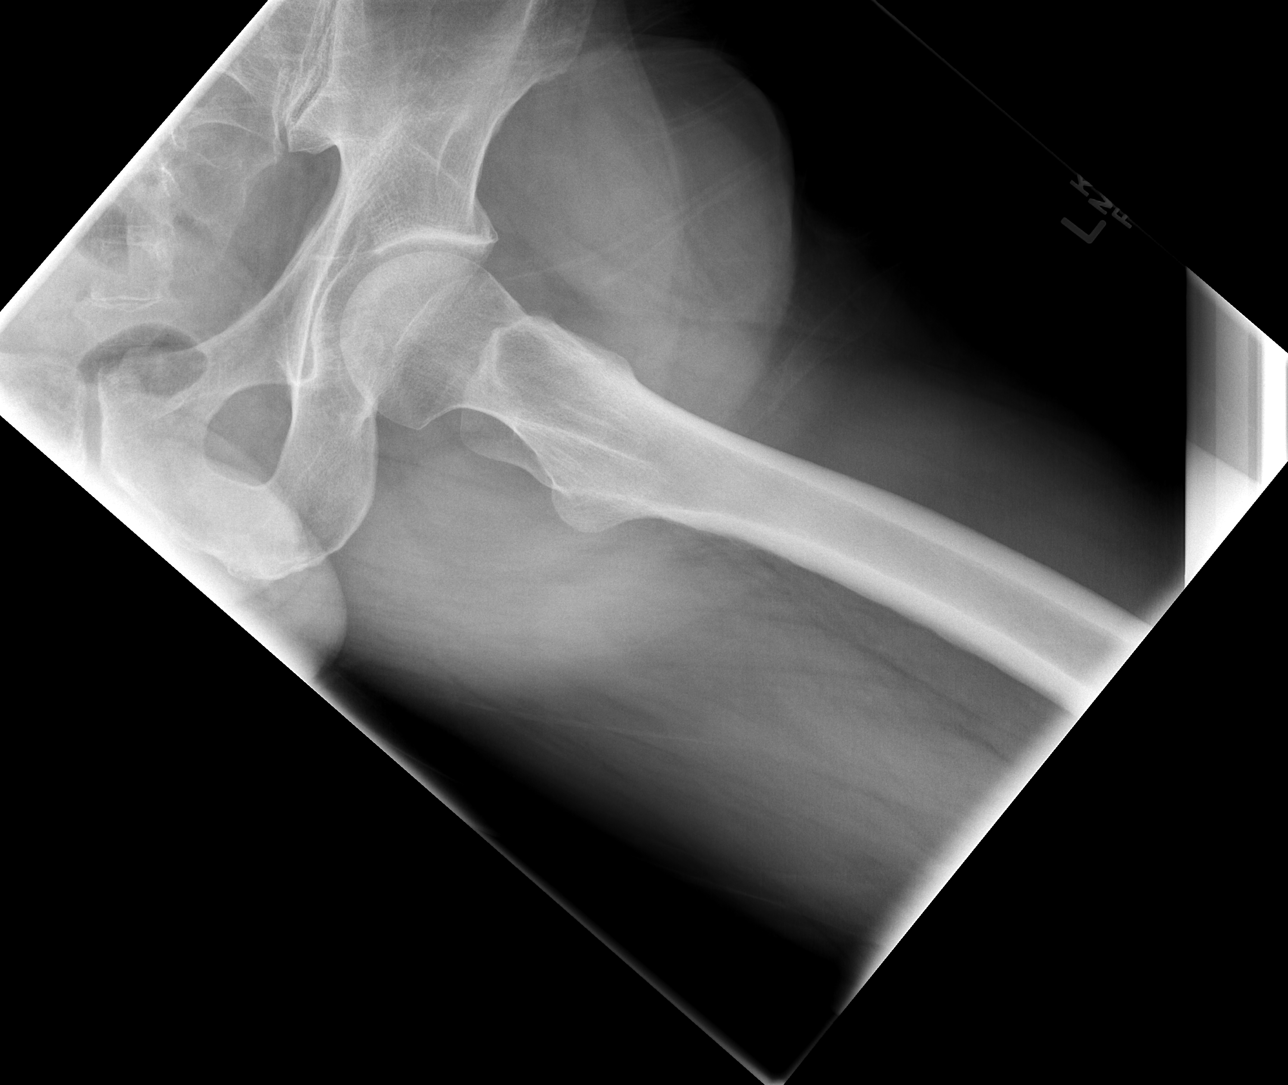

[t hip frog leg right]
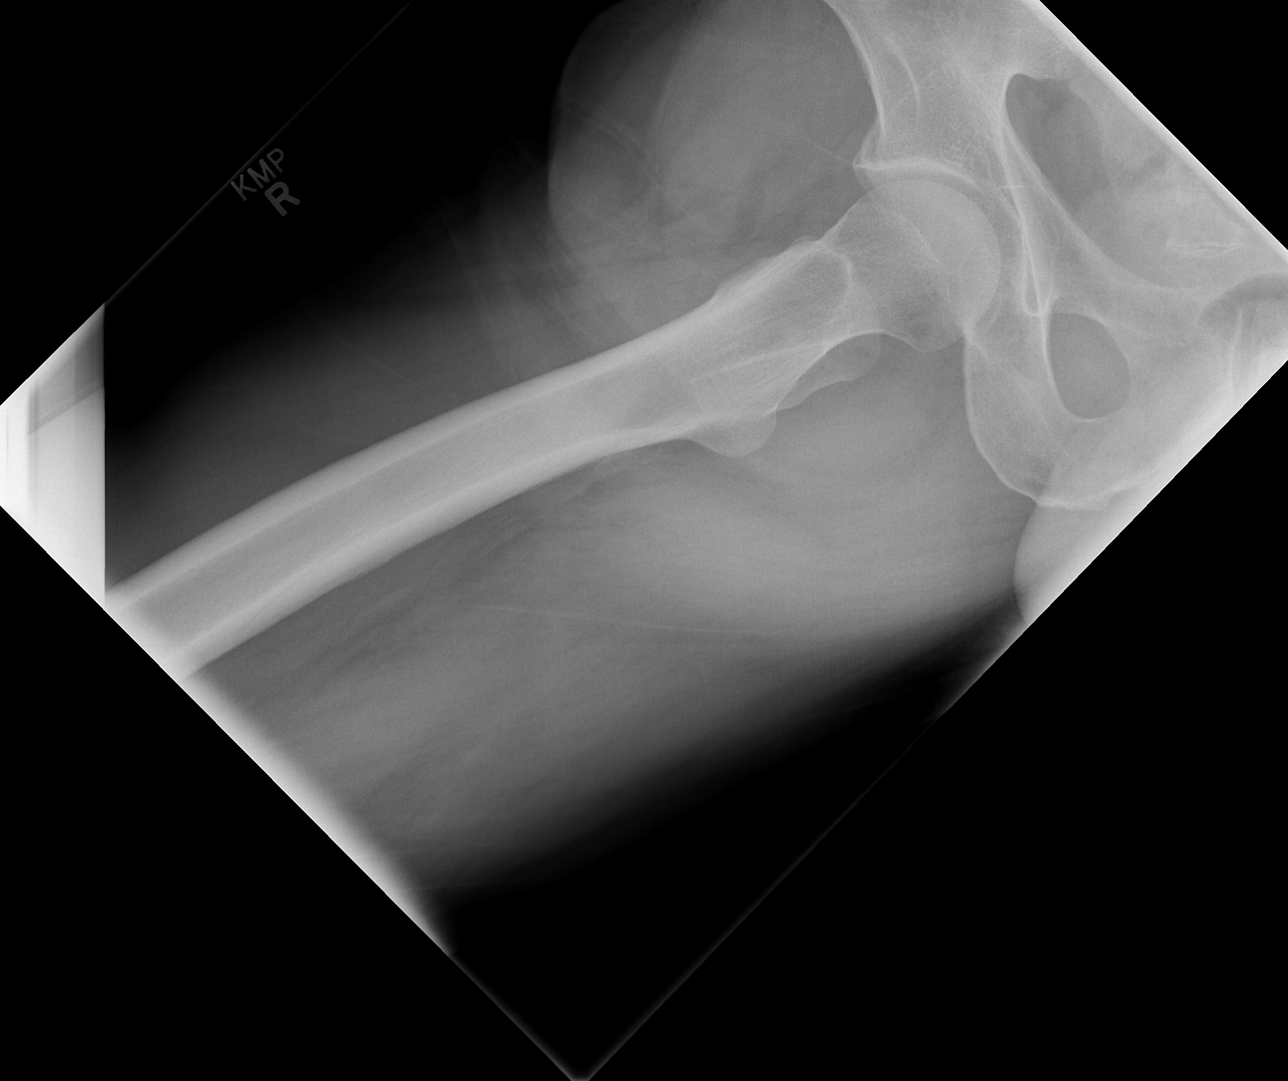

[4 of 4 positions shown; findings below may reference images not displayed]

FINDINGS: The hip joint spaces are preserved bilaterally. The articular
surfaces of the femoral heads and acetabuli remains smoothly
rounded. The femoral necks, intertrochanteric, and subtrochanteric
regions are normal. The observed portions of the bony pelvis are
unremarkable.
IMPRESSION: There is no acute or significant chronic bony abnormality of the
hips.

## 2017-02-13 IMAGING — CR DG LUMBAR SPINE COMPLETE 4+V
5 series · 5 of 5 positions shown · non-contrast
Comparison: Abdominal and pelvic CT scan November 06, 2014

CLINICAL DATA: Low back and left hip pain for the past 2 months
without known injury

EXAM:
LUMBAR SPINE - COMPLETE 4+ VIEW

[t l-spine a.p.]
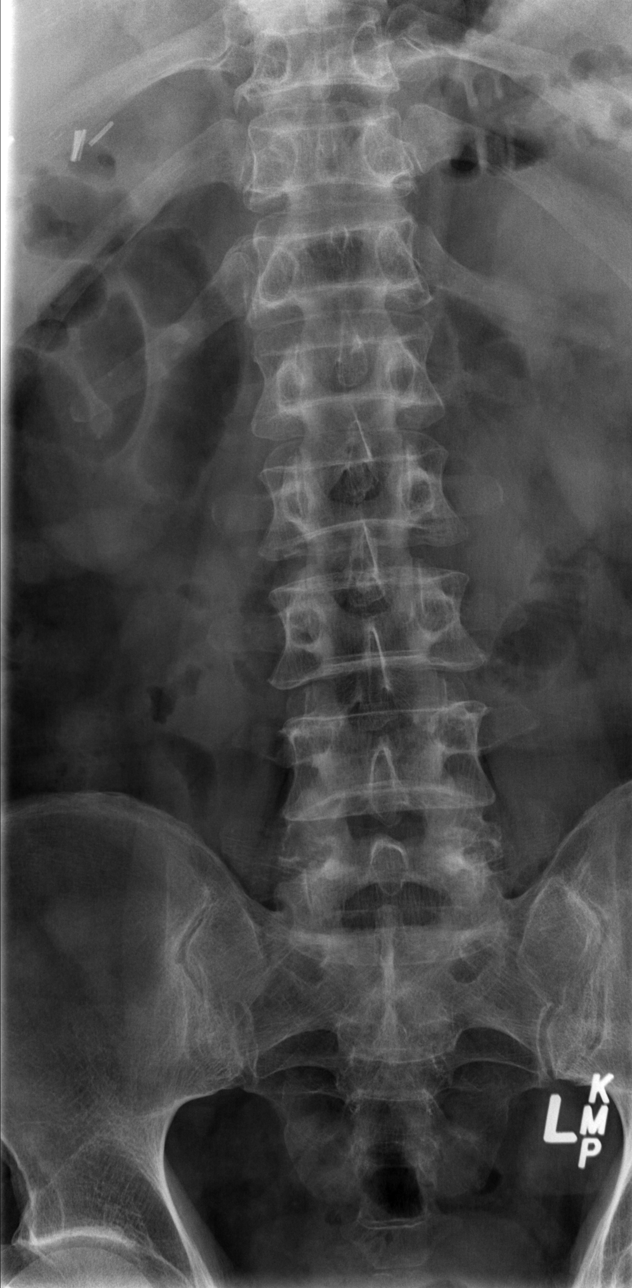

[t l-spine oblique exposure (1 of 2)]
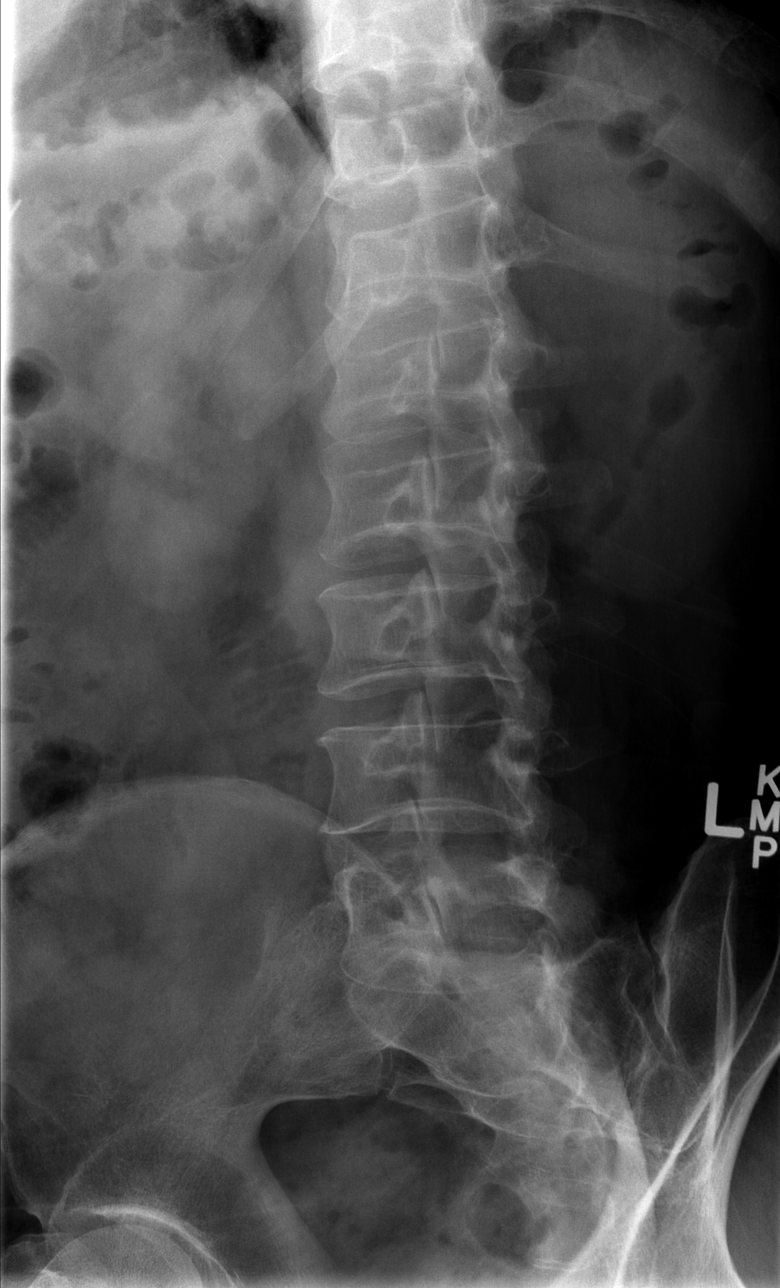

[t l-spine oblique exposure (2 of 2)]
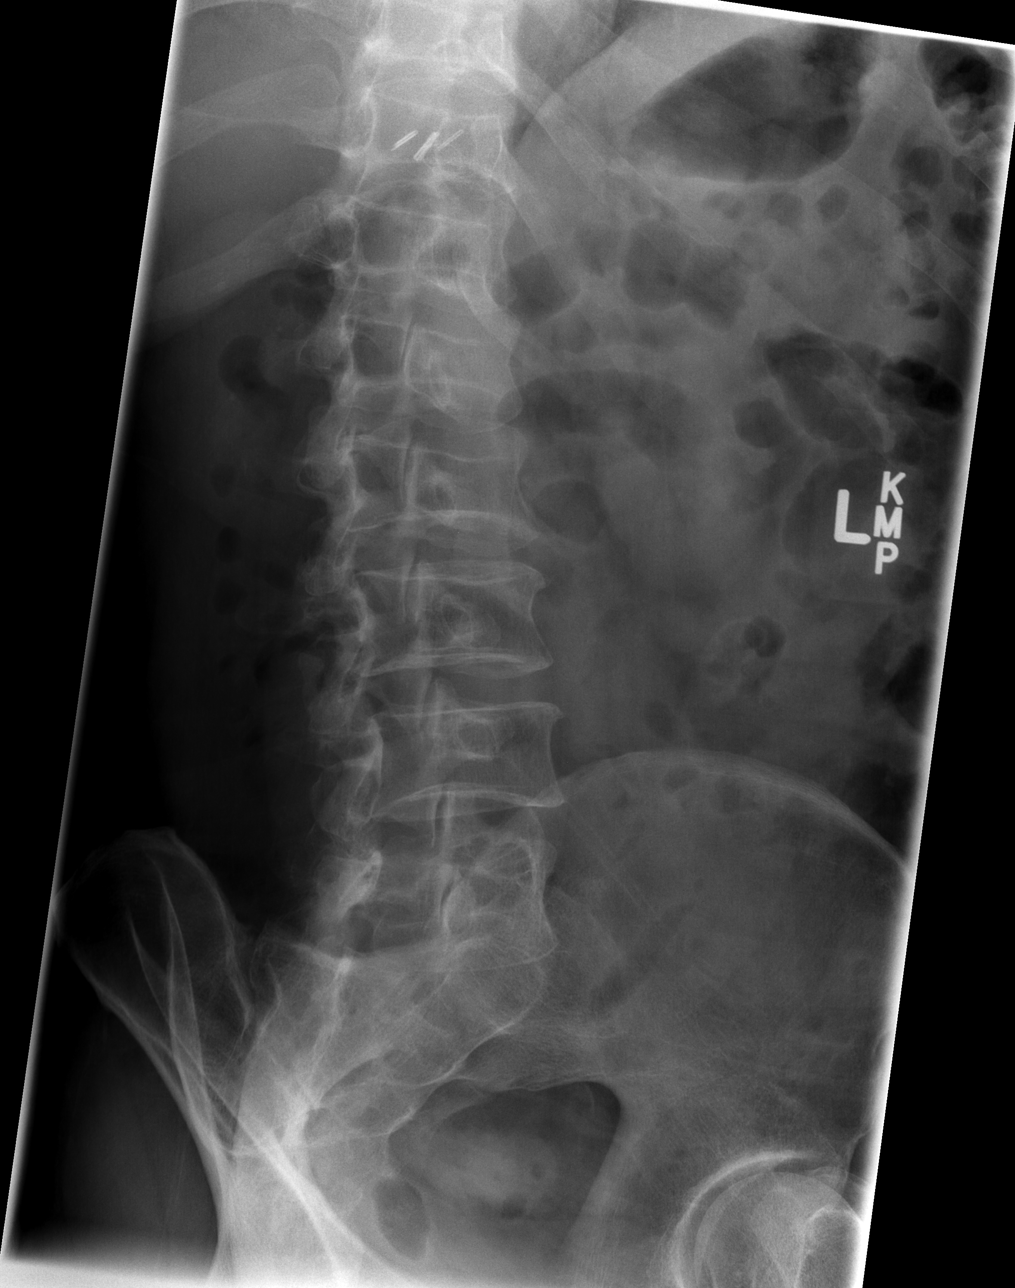

[t l-spine lat]
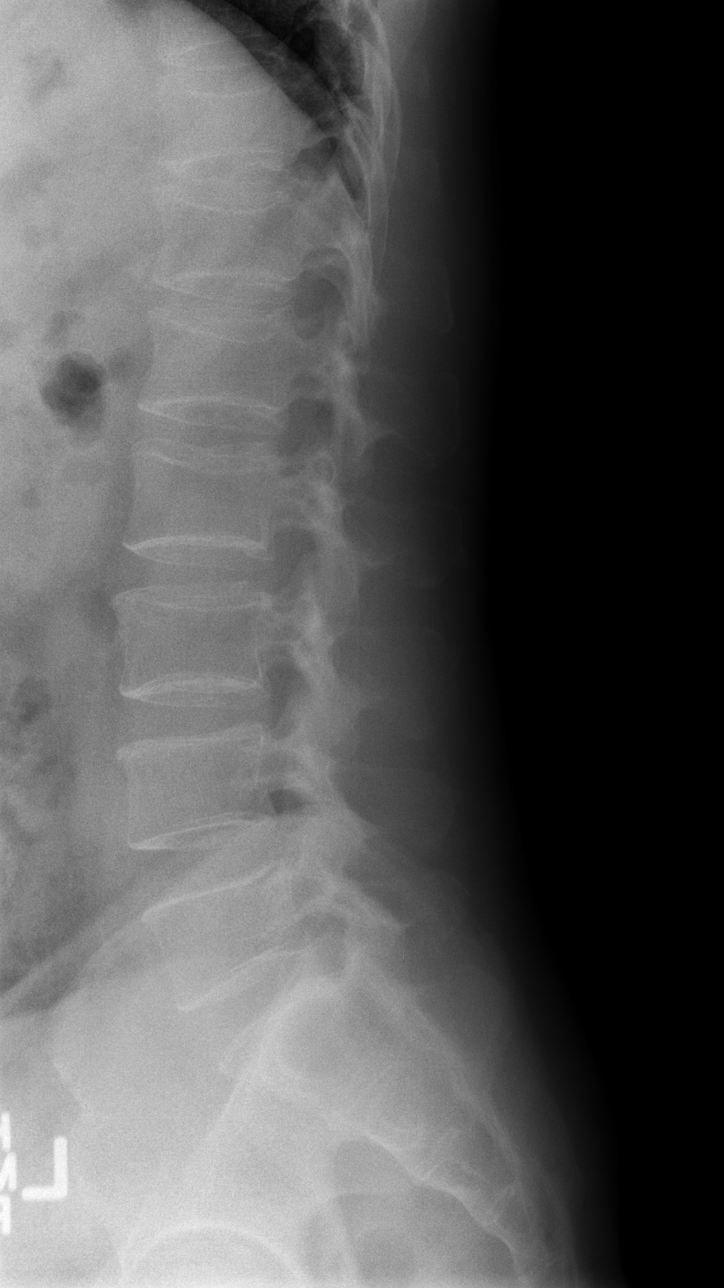

[t l-spine l5-s1 spot]
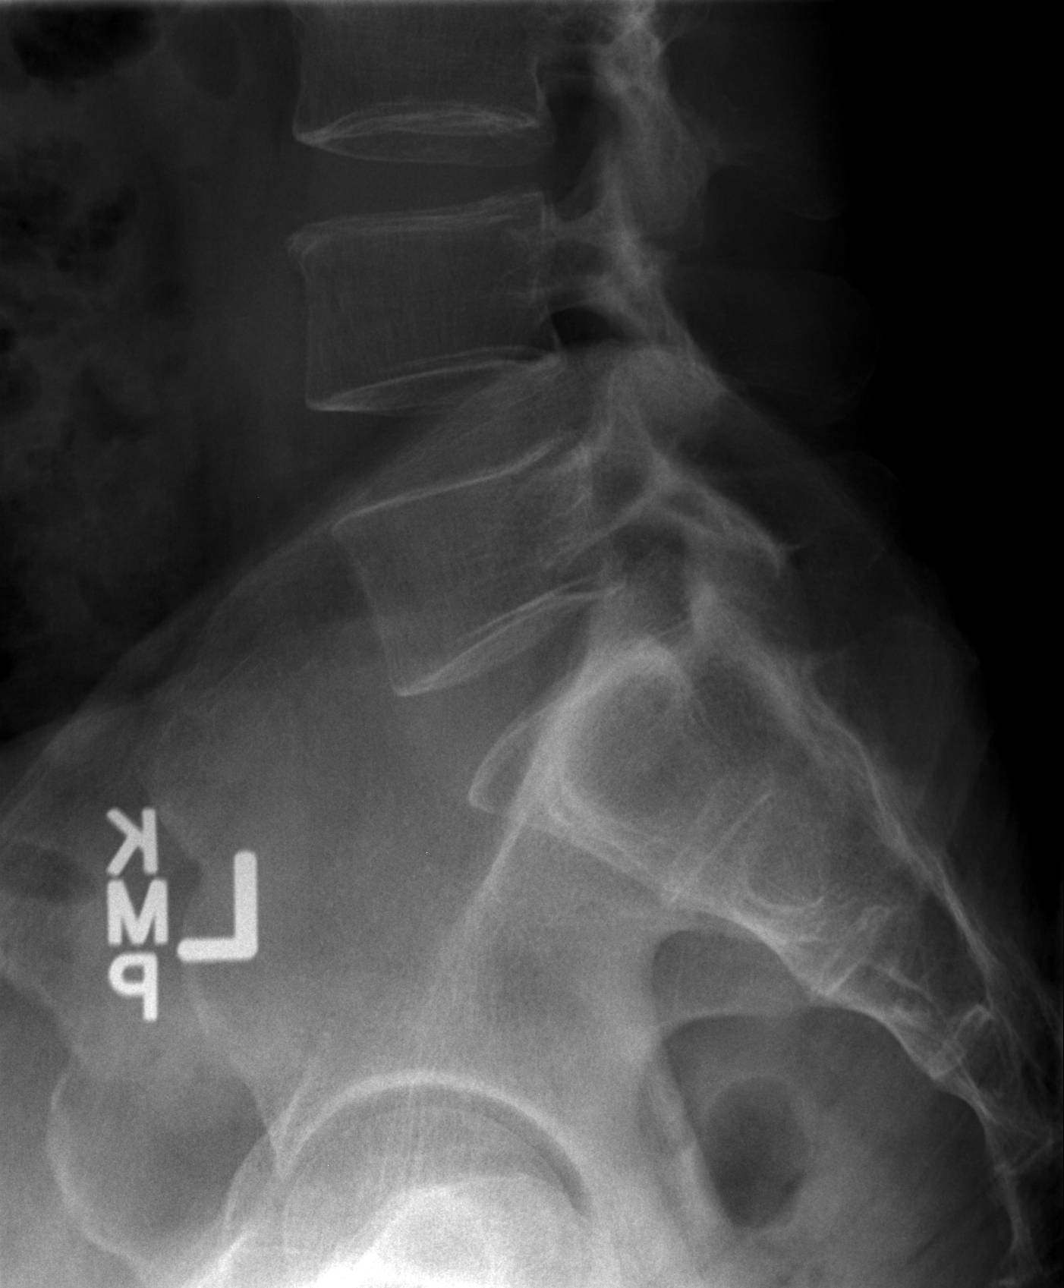

[5 of 5 positions shown; findings below may reference images not displayed]

FINDINGS: The lumbar vertebral bodies are preserved in height. The pedicles
and transverse processes are intact. There is no spondylolisthesis.
The disc space heights are well maintained. No pars defects are
observed. There is mild degenerative facet joint hypertrophy at
L5-S1. The observed portions of the sacrum exhibit no acute
abnormalities.
IMPRESSION: There is no acute or significant chronic bony abnormality of the
lumbar spine. Mild degenerative facet joint change at L5-S1 is
present bilaterally.

## 2018-01-22 ENCOUNTER — Encounter: Payer: Self-pay | Admitting: Internal Medicine

## 2019-12-24 ENCOUNTER — Other Ambulatory Visit: Payer: Self-pay | Admitting: Family Medicine

## 2019-12-24 ENCOUNTER — Ambulatory Visit: Payer: BC Managed Care – PPO | Admitting: Family Medicine

## 2019-12-24 ENCOUNTER — Other Ambulatory Visit: Payer: Self-pay

## 2019-12-24 VITALS — BP 127/84 | HR 58 | Temp 98.2°F | Resp 18 | Ht 68.0 in

## 2019-12-24 DIAGNOSIS — R030 Elevated blood-pressure reading, without diagnosis of hypertension: Secondary | ICD-10-CM

## 2019-12-24 DIAGNOSIS — Z114 Encounter for screening for human immunodeficiency virus [HIV]: Secondary | ICD-10-CM

## 2019-12-24 DIAGNOSIS — Z6828 Body mass index (BMI) 28.0-28.9, adult: Secondary | ICD-10-CM

## 2019-12-24 DIAGNOSIS — E785 Hyperlipidemia, unspecified: Secondary | ICD-10-CM | POA: Diagnosis not present

## 2019-12-24 DIAGNOSIS — Z131 Encounter for screening for diabetes mellitus: Secondary | ICD-10-CM

## 2019-12-24 DIAGNOSIS — Z87442 Personal history of urinary calculi: Secondary | ICD-10-CM | POA: Diagnosis not present

## 2019-12-24 DIAGNOSIS — R300 Dysuria: Secondary | ICD-10-CM | POA: Diagnosis not present

## 2019-12-24 DIAGNOSIS — Z125 Encounter for screening for malignant neoplasm of prostate: Secondary | ICD-10-CM

## 2019-12-24 LAB — POCT URINALYSIS DIP (CLINITEK)
Bilirubin, UA: NEGATIVE
Blood, UA: NEGATIVE
Glucose, UA: NEGATIVE mg/dL
Ketones, POC UA: NEGATIVE mg/dL
Leukocytes, UA: NEGATIVE
Nitrite, UA: NEGATIVE
POC PROTEIN,UA: NEGATIVE
Spec Grav, UA: >=1.03 — AB (ref 1.010–1.025)
Urobilinogen, UA: 0.2 E.U./dL
pH, UA: 6 (ref 5.0–8.0)

## 2019-12-24 NOTE — Patient Instructions (Addendum)
Please sign up for MyChart using the authorization code provided on your paperwork today.  This method allows [place] to see you your lab results along with comments and indications for treatment if needed.  This information is shared with all of your healthcare providers via the portal.  You will be notified of your lab results in 5-7 business days.  Any additional medication is needed medications will be sent to the pharmacy which is listed on file.  Have included some information regarding heart healthy diet as well as how to achieve healthy weight loss.  Both of these measures will help improve your cholesterol and help maintain good blood pressure control.  As recommended by your primary care provider continue to watch and monitor your blood pressure a few times per week.  If your blood pressure is routinely consistently greater than 130/90 follow-up immediately with your primary care provider.    Regstrese en MyChart utilizando el cdigo de autorizacin proporcionado en su documentacin hoy mismo.  Este mtodo nos permite ver los [place] de su laboratorio junto con comentarios e indicaciones para el tratamiento si es necesario.  Esta informacin se comparte con todos sus proveedores de atencin mdica a travs del portal.  Se le notificarn los resultados de su laboratorio en 5-7 [employer].  Cualquier medicamento adicional es necesario medicamentos sern enviados a la farmacia que aparece en el archivo.  Han incluido informacin sobre la dieta saludable para el Training and development officer, as como cmo lograr una prdida de peso saludable.  Ambas medidas ayudarn a mejorar el colesterol y ayudarn a Theatre manager un buen control de la presin arterial.  Segn lo recomendado por su proveedor de atencin primaria, contine vigilando y monitoreando su presin arterial unas cuantas veces por semana.  Si su presin arterial es rutinariamente ms alta que el seguimiento 130/90 inmediatamente con su proveedor de atencin  primaria.        Hacer ejercicio para bajar de peso Exercising to [employer] El ejercicio es la actividad fsica estructurada y repetitiva que se realiza para mejorar el [place] fsico y Technical sales engineer. Hacer ejercicio de forma regular es importante para todos. Es especialmente importante si tiene sobrepeso. El sobrepeso aumenta el riesgo de tener enfermedad cardaca, accidente cerebrovascular, diabetes, presin arterial alta y varios tipos de cncer. Reducir la ingesta de caloras y hacer ejercicio pueden ayudarlo a bajar de [place]. El ejercicio por lo general se clasifica como de intensidad moderada o vigorosa. Para bajar de peso, la [employer] de las personas debe hacer una determinada cantidad de ejercicio de intensidad moderada o vigorosa cada semana. Ejercicio de intensidad moderada  El ejercicio de intensidad moderada es cualquier actividad que lo haga moverse lo suficiente como para quemar al menos tres veces ms energa (caloras) que si estuviera sentado. Algunos ejemplos de ejercicio de intensidad moderada son:  Caminar una milla (1,6 kilmetros) en 15 minutos.  Hacer trabajos de jardinera livianos.  Andar en bicicleta a un ritmo fcil de aguantar. La [employer] de las personas debe hacer al menos 150 minutos (2 horas y 30 minutos) por semana de ejercicio de intensidad moderada para Theatre manager su Engineer, site. Ejercicio de intensidad vigorosa El ejercicio de intensidad vigorosa es cualquier actividad que lo haga moverse lo suficiente como para quemar al menos seis veces ms caloras que si estuviera sentado. Al hacer ejercicio a esta intensidad, su nivel de esfuerzo debera ser lo suficientemente alto como para no permitirle Programmer, systems. Algunos ejemplos de ejercicio de intensidad vigorosa son:  Optometrist.  Practicar un deporte de equipo, como ftbol americano, baloncesto y ftbol.  Saltar la cuerda. La [employer] de las personas debe hacer al menos 75 minutos (1 hora y 15  minutos) por semana de ejercicio de intensidad vigorosa para mantener su Engineer, site. Cmo puede afectarme el ejercicio? Cuando hace suficiente ejercicio como para quemar ms caloras que las que consume, pierde [place]. Tambin reduce la grasa corporal y aumenta la masa muscular. Cuanto ms msculo tenga, mayor cantidad de Nurse, children's. El ejercicio tambin:  Mejora el estado de nimo.  Reduce el estrs y las tensiones.  Mejora el estado fsico general, la flexibilidad y la resistencia.  Aumenta la fuerza sea. La cantidad de ejercicio que necesita realizar para bajar de peso depende de:  Su edad.  El tipo de ejercicio.  Cualquier afeccin de [employer].  Su capacidad fsica general. Pregntele al mdico cunto ejercicio debe realizar y qu tipos de actividades son seguras para usted. Qu medidas puedo tomar para bajar de peso? Nutricin   [employer] Financial dieta como se lo haya indicado el mdico o especialista en alimentacin y nutricin (nutricionista). Esto puede incluir lo siguiente: ? Consumir menos caloras. ? Consumir ms protenas. ? Consumir menos grasas no saludables. ? Seguir una dieta que incluya frutas y verduras frescas, cereales integrales, productos lcteos semidescremados y Advertising account planner. ? Evite los alimentos con grasa, sal y azcar agregadas.  Beba gran cantidad de agua mientras hace ejercicio para evitar la deshidratacin o los golpes de Freight forwarder. Actividad  Elija una actividad que disfrute y establezca objetivos realistas. El mdico puede ayudarlo a Paediatric nurse un plan de ejercicio que funcione para usted.  Haga ejercicio a una intensidad moderada o vigorosa la [employer] Financial de la [place]. ? La intensidad de la actividad fsica puede variar de Ardelia Mems persona a Theatre manager. Puede saber qu tan intensa una rutina de ejercicios es para usted al Sales promotion account executive atencin a su respiracin y latidos cardacos. La [employer] Farm de las personas notar que su respiracin y  latidos cardacos se aceleran al Optometrist ejercicio de mayor intensidad.  Haga entrenamiento de resistencia dos veces por semana, como: ? Flexiones de [employer]ill [employer]. ? Abdominales. ? Levantamiento de pesas. ? Uso de bandas elsticas de resistencia.  Hacer ejercicio en perodos cortos de Estée Lauder ser tan til como los perodos largos y estructurados de ejercicio. Si tiene dificultad para encontrar tiempo para Engineer, site, trate de incluir el ejercicio en su rutina diaria. ? Levntese, estrese y camine cada 80minutos a lo largo del Training and development officer. ? Vaya a caminar durante su hora de almuerzo. ? Estacione el auto lejos de su lugar de destino. ? Si [place] transporte pblico, bjese una parada antes y camine el resto del camino. ? Pngase de pie y camine cada vez que hable por telfono. ? Utilice la Writer del ascensor o la Civil engineer, contracting.  Use ropa cmoda y calzado con buen soporte.  No haga ejercicio en exceso que pudiera hacer que se lastime, se sienta mareado o tenga dificultad para respirar. Dnde buscar ms informacin  Departamento de Salud y Servicios Humanos de los Estados Unidos (U.S. Department of Health and [employer]): BondedCompany.at  Centros para el Control y la Prevencin de Probation officer for Disease Control and Prevention, CDC): http://www.wolf.info/ Comunquese con un mdico:  Antes de comenzar un nuevo programa de ejercicios.  Si tiene preguntas o inquietudes acerca de su peso.  Si tiene un problema mdico que Producer, television/film/video. Obtenga ayuda de inmediato  si presenta alguno de los siguientes problemas al hacer ejercicio:  Lesiones.  Mareos.  Dificultad para respirar o falta de aire que no desaparecen al dejar de hacer ejercicio.  Dolor en el pecho.  Latidos cardacos rpidos. Resumen  El sobrepeso aumenta el riesgo de tener enfermedad cardaca, accidente cerebrovascular, diabetes, presin arterial alta y varios tipos de cncer.  Para perder peso  debe quemar ms caloras que las que consume.  Reducir la cantidad de caloras que consume, adems de hacer ejercicio de intensidad moderada o vigorosa todas las [place], [place] a Administrator, Civil Service. Esta informacin no tiene Marine scientist el consejo del mdico. Asegrese de hacerle al mdico cualquier pregunta que tenga. Document Revised: 11/24/2017 Document Reviewed: 11/24/2017 Elsevier Patient Education  [place] de alimentacin DASH DASH Eating Plan DASH es la sigla en ingls de "Enfoques Alimentarios para Detener la Hipertensin" (Dietary Approaches to Stop Hypertension). El plan de alimentacin DASH ha demostrado bajar la presin arterial elevada (hipertensin). Tambin puede reducir UnitedHealth de diabetes tipo 2, enfermedad cardaca y accidente cerebrovascular. Este plan tambin puede ayudar a Horticulturist, commercial. Consejos para seguir este plan  Pautas generales  Evite ingerir ms de 2,300 mg (miligramos) de sal (sodio) por da. Si tiene hipertensin, es posible que necesite reducir la ingesta de sodio a 1,500 mg por da.  Limite el consumo de alcohol a no ms de 57medida por da si es mujer y no est [place], y 75medidas por da si es hombre. Una medida equivale a 12oz (344ml) de cerveza, 5oz (130ml) de vino o 1oz (73ml) de bebidas alcohlicas de alta graduacin.  Trabaje con su mdico para mantener un peso saludable o perder [employer]. Pregntele cul es el peso recomendado para usted.  Realice al menos 30 minutos de ejercicio que haga que se acelere su corazn (ejercicio Arboriculturist) la [employer] Financial de la [place]. Estas actividades pueden incluir caminar, nadar o andar en bicicleta.  Trabaje con su mdico o especialista en alimentacin y nutricin (nutricionista) para ajustar su plan alimentario a sus necesidades calricas personales. Lectura de las etiquetas de los alimentos   Verifique en las etiquetas de los alimentos, la cantidad de sodio por porcin. Elija  alimentos con menos del 5 por ciento del valor diario de sodio. Generalmente, los alimentos con menos de 300 mg de sodio por porcin se encuadran dentro de este plan alimentario.  Para encontrar cereales integrales, busque la palabra "integral" como primera palabra en la lista de ingredientes. De compras  Compre productos en los que en su etiqueta diga: "bajo contenido de sodio" o "sin agregado de sal".  Compre alimentos frescos. Evite los alimentos enlatados y comidas precocidas o congeladas. Coccin  Evite agregar sal cuando cocine. Use hierbas o aderezos sin sal, en lugar de sal de mesa o sal marina. Consulte al mdico o farmacutico antes de usar sustitutos de la sal.  No fra los alimentos. A la hora de cocinar los alimentos opte por hornearlos, hervirlos, grillarlos y asarlos a Administrator, arts.  Cocine con aceites cardiosaludables, como oliva, canola, soja o girasol. Planificacin de las comidas  Consuma una dieta equilibrada, que incluya lo siguiente: ? 5o ms porciones de frutas y Set designer. Trate de que la mitad del plato de cada comida sean frutas y verduras. ? Hasta 6 u 8 porciones de cereales integrales por da. ? Menos de 6 onzas de carne, aves o pescado Games developer. Una porcin de 3 onzas de carne tiene casi el mismo  tamao que un mazo de cartas. Un huevo equivale a 1 onza. ? Dos porciones de productos lcteos descremados por Training and development officer. ? Una porcin de frutos secos, semillas o frijoles 5 veces por semana. ? Grasas cardiosaludables. Las grasas saludables llamadas cidos grasos omega-3 se encuentran en alimentos como semillas de lino y pescados de agua fra, como por ejemplo, sardinas, salmn y caballa.  Limite la cantidad que ingiere de los siguientes alimentos: ? Alimentos enlatados o envasados. ? Alimentos con alto contenido de grasa trans, como alimentos fritos. ? Alimentos con alto contenido de grasa saturada, como carne con grasa. ? Dulces, postres, bebidas azucaradas  y otros alimentos con azcar agregada. ? Productos lcteos enteros.  No le agregue sal a los alimentos antes de probarlos.  Trate de comer al menos 2 comidas vegetarianas por semana.  Consuma ms comida casera y menos de restaurante, de bufs y comida rpida.  Cuando coma en un restaurante, pida que preparen su comida con menos sal o, en lo posible, sin nada de sal. Qu alimentos se recomiendan? Los alimentos enumerados a continuacin no constituyen Furniture conservator/restorer. Hable con el nutricionista sobre las mejores opciones alimenticias para usted. Cereales Pan de salvado o integral. Pasta de salvado o integral. Arroz integral. Avena. Quinua. Trigo burgol. Cereales integrales y con bajo contenido de sodio. Pan pita. Galletitas de [place] con bajo contenido de [place] y Sula. Tortillas de [place] integral. Verduras Verduras frescas o congeladas (crudas, al vapor, asadas o grilladas). Jugos de tomate y verduras con bajo contenido de sodio o reducidos en sodio. Salsa y pasta de tomate con bajo contenido de sodio o reducidas en sodio. Verduras enlatadas con bajo contenido de sodio o reducidas en sodio. Frutas Todas las frutas frescas, congeladas o disecadas. Frutas enlatadas en jugo natural (sin agregado de azcar). Carne y otros alimentos proteicos Pollo o pavo sin piel. Carne de pollo o de [place]. Cerdo desgrasado. Pescado y [employer]. Claras de huevo. Porotos, guisantes o lentejas secos. Frutos secos, mantequilla de frutos secos y semillas sin sal. Frijoles enlatados sin sal. Cortes de carne vacuna magra, desgrasada. Embutidos magros, con bajo contenido de [place]. Lcteos Leche descremada (1%) o descremada. Quesos sin grasa, con bajo contenido de grasa o descremados. Queso blanco o ricota sin grasa, con bajo contenido de [place]. Yogur semidescremado o descremado. Queso con bajo contenido de [place] y [place]. Grasas y [employer] untables que no contengan grasas trans. Aceite vegetal. Lubertha Basque  y aderezos para ensaladas livianos o con bajo contenido de grasas (reducidos en sodio). Aceite de canola, crtamo, oliva, soja y [place]. [place]. Condimentos y otros alimentos Hierbas. Especias. Mezclas de condimentos sin sal. Palomitas de maz y pretzels sin sal. Dulces con bajo contenido de grasas. Qu alimentos no se recomiendan? Los alimentos enumerados a continuacin no constituyen Furniture conservator/restorer. Hable con el nutricionista sobre las mejores opciones alimenticias para usted. Cereales Productos de panificacin hechos con grasa, como medialunas, magdalenas y algunos panes. Comidas con arroz o pasta seca listas para usar. Verduras Verduras con crema o fritas. Verduras en [place]. Verduras enlatadas regulares (que no sean con bajo contenido de sodio o reducidas en sodio). Pasta y salsa de tomates enlatadas regulares (que no sean con bajo contenido de sodio o reducidas en sodio). Jugos de tomate y verduras regulares (que no sean con bajo contenido de sodio o reducidos en sodio). Pepinillos. Aceitunas. Lambert Mody Fruta enlatada en almbar liviano o espeso. Frutas cocidas en aceite. Frutas con salsa de crema o [place]. Carne y  otros alimentos proteicos Cortes de carne con grasa. Costillas. Carne frita. [place]. Salchichas. [place] y otras carnes procesadas. Salame. Panceta. Perros calientes (hotdogs). [place]. Frutos secos y semillas con sal. Frijoles enlatados con agregado de sal. Pescado enlatado o ahumado. Huevos enteros o yemas. Pollo o pavo con piel. Lcteos Leche entera o al 2%, crema y mitad leche y mitad crema. Queso crema entero o con toda su grasa. Yogur entero o endulzado. Quesos con toda su grasa. Sustitutos de cremas no lcteas. Coberturas batidas. Quesos para untar y quesos procesados. Grasas y [employer]. Margarina en barra. [place]. Materia grasa. Mantequilla clarificada. Grasa de panceta. Aceites tropicales como aceite de coco, palmiste o  palma. Condimentos y otros alimentos Palomitas de maz y pretzels con sal. Sal de cebolla, sal de ajo, sal condimentada, sal de mesa y sal marina. Salsa [place]. Salsa trtara. Salsa barbacoa. Salsa teriyaki. Salsa de soja, incluso la que tiene contenido reducido de [place]. Salsa de carne. Salsas en lata y envasadas. Salsa de pescado. Salsa de [place]. Salsa rosada. Rbano picante envasado. [place]. [place]. Saborizantes y tiernizantes para carne. Caldo en cubitos. Salsa picante y salsa tabasco. Escabeches envasados o ya preparados. Aderezos para tacos prefabricados o envasados. Salsas. Aderezos comunes para ensalada. Dnde encontrar ms informacin:  [place], los Pulmones y Herbalist (National Heart, Lung, and [place]): https://wilson-eaton.com/  Asociacin Estadounidense del Corazn (American Heart Association): www.heart.org Resumen  El plan de alimentacin DASH ha demostrado bajar la presin arterial elevada (hipertensin). Tambin puede reducir UnitedHealth de diabetes tipo 2, enfermedad cardaca y accidente cerebrovascular.  Con el plan de alimentacin DASH, deber limitar el consumo de sal (sodio) a 2,300 mg por da. Si tiene hipertensin, es posible que necesite reducir la ingesta de sodio a 1,500 mg por da.  Cuando siga el plan de alimentacin DASH, trate de comer ms frutas frescas y verduras, cereales integrales, carnes magras, lcteos descremados y grasas cardiosaludables.  Trabaje con su mdico o especialista en alimentacin y nutricin (nutricionista) para ajustar su plan alimentario a sus necesidades calricas personales. Esta informacin no tiene Marine scientist el consejo del mdico. Asegrese de hacerle al mdico cualquier pregunta que tenga. Document Revised: 01/16/2017 Document Reviewed: 01/16/2017 Elsevier Patient Education  [place] hipertensin Preventing Hypertension La hipertensin, conocida comnmente como  presin arterial alta, se produce cuando la sangre bombea en las arterias con mucha fuerza. Las arterias son vasos sanguneos que transportan la sangre desde el corazn al resto del cuerpo. Con el transcurso del [place], la hipertensin puede daar las arterias y Transport planner flujo de sangre hacia partes importantes del cuerpo que incluyen el cerebro, el corazn y los riones. Con frecuencia, la hipertensin no causa sntomas hasta que la presin arterial es muy alta. Por este motivo, es importante que controle regularmente su presin arterial. La hipertensin se puede prevenir con frecuencia con cambios en la dieta y el estilo de vida. Si ya tiene hipertensin, puede controlarla con cambios en la dieta y el estilo de vida y con medicamentos. Qu cambios en la alimentacin se pueden hacer? Mantenga una dieta saludable. Esto incluye lo siguiente:  Menor ingesta de sal (sodio). Pregntele al mdico cunto sodio puede consumir de forma segura. La recomendacin general es consumir menos de 1cucharadita (2300mg ) de sodio por da. ? No agregue sal a las comidas. ? Opte por alimentos con bajo contenido de sodio cuando realice las compras o coma fuera de casa.  Limite la  cantidad de grasa en la dieta. Esto se puede lograr con [employer] o de bajo contenido de grasas e ingiriendo menor cantidad de carnes rojas.  Coma ms frutas, verduras y cereales integrales. Establezca un objetivo para comer: ? 1 a 2tazas de frutas y verduras frescas todos los das. ? 3 a 4porciones de cereales [employer].  Evite los alimentos y las bebidas que tengan azcares agregados.  Coma pescados que contengan grasas saludables (cidos grasos omega-3), como la caballa o el salmn. Si necesita implementar un plan de comidas saludable, pruebe la dieta DASH. Esta dieta tiene un alto contenido de frutas, verduras y Psychologist, prison and probation services. Incluye poca cantidad de sodio, carnes rojas y azcares agregados. DASH  es la sigla en ingls de "Enfoques Alimentarios para Detener la Hipertensin". Qu cambios en el estilo de vida se pueden realizar?   Baje de peso si es necesario. Con tan solo bajar entre el 3% y el 5% del peso corporal puede prevenir o controlar la hipertensin. ? Por ejemplo, si su peso actual es de 200libras (91kg), una prdida entre el 3% y el 5% de su peso significa perder entre 6 y 10libras (2,7 a 4,5kg). ? Pdale al mdico que le recomiende una dieta y un plan de ejercicios para bajar de peso de forma segura.  Ejerctese lo suficiente. Debe realizar al menos 128minutos de ejercicios de intensidad moderada todas las semanas. ? Patent examiner en sesiones cortas de ejercicios, varias veces al da, o puede realizar sesiones ms largas, pero menos veces por semana. Por ejemplo, puede realizar una caminata enrgica o andar en bicicleta durante 3minutos, 3veces al da, durante 5das a la semana.  Encuentre maneras de reducir el estrs, como hacer ejercicios, Radio broadcast assistant, Conservation officer, nature o tomar una clase de yoga. Si necesita ayuda para reducir [employer] de estrs, consulte al mdico.  No fume. Esto incluye los cigarrillos electrnicos. Las sustancias qumicas presentes en los productos con tabaco y nicotina elevan su presin arterial cada vez que fuma. Si necesita ayuda para dejar de fumar, consulte al mdico.  Evite el alcohol. Si bebe alcohol, limite el consumo a no ms de 80medida por da si es mujer y no est [place], y 80medidas por da si es hombre. Una medida equivale a 12onzas de cerveza, 5onzas de vino o 1onzas de bebidas alcohlicas de alta graduacin. Por qu son importantes estos cambios? Los [employer] dieta y el estilo de vida pueden ayudar a prevenir la hipertensin y a Engineer, civil (consulting) al mejorar su calidad de vida. Si tiene hipertensin, Games developer ayudarn a Therapist, sports y a Naval architect ms importantes, como, por  ejemplo:  El endurecimiento y Pharmacist, hospital de las arterias que proveen sangre a: ? Su corazn. Esto puede producirle un infarto de miocardio. ? Su cerebro. Esto puede ser la causa de un accidente cerebrovascular. ? Los riones. Esto puede causar insuficiencia renal.  Estrs en el msculo cardaco, lo que puede producir insuficiencia cardaca. Qu puedo hacer para reducir mis riesgos?   Trabaje junto al mdico para desarrollar un plan de prevencin de la hipertensin que funcione para usted. Siga su plan y concurra a todas las visitas de control como se lo haya indicado el mdico.  Aprenda a medir su presin arterial en casa. Asegrese de Civil engineer, contracting su objetivo de presin arterial, como se lo haya indicado el mdico. Cmo se trata? Adems de los cambios en la dieta y el estilo de vida, [place] mdico podr indicarle  medicamentos para ayudarle a bajar su presin arterial. Tal vez deba probar distintos medicamentos hasta encontrar el ms adecuado para usted. Quiz necesite tomar ms de uno. Tome los medicamentos de venta libre y los recetados solamente como se lo haya indicado el mdico. Dnde encontrar 67 Su mdico puede ayudarle a prevenir la hipertensin y Theatre manager su presin arterial en un nivel saludable. Su hospital o comunidad locales tambin pueden proporcionarle servicios y programas de prevencin. La Asociacin Americana del Corazn (American Heart Association) ofrece un red de soporte en lnea en: CheapBootlegs.com.cy Dnde encontrar ms informacin Obtenga ms informacin sobre la hipertensin en:  [place], del Pulmn y de Herbalist (National Heart, Lung, and [place]): ElectronicHangman.is  Centros para el control y Engineer, materials prevencin de Probation officer for Disease Control and Prevention, CDC): https://ingram.com/  [place] ([place] of  Family Physicians): http://familydoctor.org/familydoctor/en/diseases-conditions/high-blood-pressure.printerview.all.html Obtenga ms informacin sobre la dieta DASH en:  [place], del Pulmn y de Herbalist (National Heart, Lung, and [place]): https://www.reyes.com/ Comunquese con un mdico si:  Piensa que tiene Nurse, mental health a los medicamentos que ha tomado.  Tiene mareos o dolores de cabeza con Scientist, research (physical sciences).  Tiene hinchazn en los tobillos.  Tiene problemas de visin. Resumen  La hipertensin con frecuencia no provoca sntomas hasta que la presin arterial es muy alta. Es importante que controle regularmente su presin arterial.  Los cambios en la dieta y el estilo de vida son los pasos ms importantes hacia la prevencin de la hipertensin.  Si mantiene su presin arterial en un nivel saludable, podr prevenir complicaciones como infarto de miocardio, insuficiencia cardaca, accidente cerebrovascular e insuficiencia renal.  Trabaje junto al mdico para desarrollar un plan de prevencin de la hipertensin que funcione para usted. Esta informacin no tiene Marine scientist el consejo del mdico. Asegrese de hacerle al mdico cualquier pregunta que tenga. Document Revised: 01/04/2017 Document Reviewed: 10/11/2015 Elsevier Patient Education  2020 [employer].

## 2019-12-24 NOTE — Progress Notes (Signed)
Patient ID: James Dean, male    DOB: September 12, 1966, 54 y.o.   MRN: [id number]  PCP: Thurman Coyer, MD  Chief Complaint  Patient presents with  . Follow-up      Subjective:  HPI James Dean is a 54 y.o. male presents to [facility] for routine health screening with labs.  Medical history significant for personal history of renal stones, overweight, hyperlipidemia, elevated BP without a history of hypertension.  Bolden reports concern for blood pressure. His PCP has asked that he check it several times per week and he endorses readings slightly higher than today occasionally. He denies chest pain or dizziness. He is inactive of routine exercise and eats a non-restrictive diet. Denies a prior history or known family history of diabetes.  He is  requesting to have his urine checked as he as experienced mild dysuria over the last few days and reports a personal history of kidney stones. He denies chills, fever, nausea, or vomiting. He denies visible hematuria. No abdominal or back pain.       Review of Systems Pertinent negatives listed in HPI Patient Active Problem List   Diagnosis Date Noted  . Paresthesia 03/11/2016  . Vertigo 03/11/2016  . Hx of adenomatous colonic polyps 03/29/2013  . Hemorrhoids 03/29/2013  . GERD (gastroesophageal reflux disease) 01/11/2013      Prior to Admission medications   Medication Sig Start Date End Date Taking? Authorizing Provider  atorvastatin (LIPITOR) 40 MG tablet Take by mouth. 10/14/19 10/13/20 Yes [provider]  esomeprazole (NEXIUM) 20 MG capsule Take by mouth.   Yes [provider]  Olopatadine HCl 0.2 % SOLN Place 1 drop into both eyes daily. 11/01/19  Yes [provider]  tadalafil (CIALIS) 5 MG tablet Take by mouth. 12/13/19  Yes [provider]    Past Medical, Surgical Family and Social History reviewed and updated.    Objective:   Today's Vitals   12/24/19 1146   BP: 127/84  Pulse: (!) 58  Resp: 18  Temp: 98.2 F (36.8 C)  TempSrc: Oral  SpO2: 100%  Height: 5\' 8"  (1.727 m)  PainSc: 0-No pain    Wt Readings from Last 3 Encounters:  07/14/16 186 lb (84.4 kg)  03/11/16 186 lb (84.4 kg)  03/29/13 201 lb 8 oz (91.4 kg)     Physical Exam Constitutional: Patient appears well-developed, overweight and well-nourished. Alert, cooperative, without distress. HENT: Normocephalic, atraumatic, External right and left ear normal. Oropharynx is clear and moist.  Eyes: Conjunctivae and EOM are normal. PERRLA, no scleral icterus. Neck: Normal ROM. Neck supple. No JVD. No tracheal deviation. No thyromegaly. No cervical adenopathy  CVS: RRR, S1/S2 +, no murmurs, no gallops, no carotid bruit.  Pulmonary: Effort and breath sounds normal, no stridor, rhonchi, wheezes, rales.  Abdominal: Soft. BS +, no distension, tenderness, rebound or guarding. No CVA tenderness  Musculoskeletal: Normal range of motion. No edema and no tenderness.  Skin: Skin is warm and dry. No rash noted. Not diaphoretic. No erythema. No pallor. Psychiatric: Normal mood and affect. Behavior, judgment, thought content normal.     Assessment & Plan:  1. Hyperlipidemia, unspecified hyperlipidemia type -Continue atorvastatin. -Unable to check lipids today, pt is not fasting. -Will check TSH to rule out any abnormal elevation of thyroid hormone which can correlate to poor lipid control   2. BMI 28.0-28.9,adult -Encouraged efforts to reduce weight include engaging in physical activity as tolerated with goal of 150 minutes per week. Improve dietary  choices and eat a meal regimen consistent with a Mediterranean or DASH diet. Reduce simple carbohydrates. Do not skip meals and eat healthy snacks throughout the day to avoid over-eating at dinner. Set a goal weight loss that is achievable for you. - Thyroid Panel With TSH  3. Blood pressure elevated without history of HTN -Encouraged DASH  diet -Encouraged routine physical exercise   4. Personal history of kidney stones -Followed by urologu  5. Screening for HIV (human immunodeficiency virus) - HIV antibody (with reflex)  6. Dysuria -Personal hx of renal stone with intermittent dysuria.  -UA pending.Checking CBC with Differential.  7. Screening PSA (prostate specific antigen), routine surveillance screening age >24  - PSA, total and free  8. Screening for diabetes mellitus -Encouraged to exercise to prevent disease. - Comprehensive metabolic panel - Hemoglobin A1c   Orders Placed This Encounter  Procedures  . Comprehensive metabolic panel  . CBC with Differential  . Thyroid Panel With TSH  . HIV antibody (with reflex)  . PSA, total and free  . Hemoglobin A1c   Will follow-up 5-7 days with results of today's lab work.  -The patient was given clear instructions to go to ER or return to medical center if symptoms do not improve, worsen or new problems develop. The patient verbalized understanding.   A total of 45 minutes spent, greater than 50 % of this time was spent with use of spanish interpter service to obtan HPI, screen for previous illness, exam, and provide discharge counseling and coordination of care.   Molli Barrows, FNP Carroll Sage. Kenton Kingfisher, MSN, [facility]  [address] [place], [place], [place] [address] [phone]

## 2019-12-27 LAB — COMPREHENSIVE METABOLIC PANEL
ALT: 32 IU/L (ref 0–44)
AST: 21 IU/L (ref 0–40)
Albumin/Globulin Ratio: 1.9 (ref 1.2–2.2)
Albumin: 4.5 g/dL (ref 3.8–4.9)
Alkaline Phosphatase: 90 IU/L (ref 39–117)
BUN/Creatinine Ratio: 12 (ref 9–20)
BUN: 12 mg/dL (ref 6–24)
Bilirubin Total: 0.4 mg/dL (ref 0.0–1.2)
CO2: 23 mmol/L (ref 20–29)
Calcium: 9.2 mg/dL (ref 8.7–10.2)
Chloride: 103 mmol/L (ref 96–106)
Creatinine, Ser: 1.01 mg/dL (ref 0.76–1.27)
GFR calc Af Amer: 97 mL/min/{1.73_m2} (ref >59)
GFR calc non Af Amer: 84 mL/min/{1.73_m2} (ref >59)
Globulin, Total: 2.4 g/dL (ref 1.5–4.5)
Glucose: 94 mg/dL (ref 65–99)
Potassium: 4.4 mmol/L (ref 3.5–5.2)
Sodium: 141 mmol/L (ref 134–144)
Total Protein: 6.9 g/dL (ref 6.0–8.5)

## 2019-12-27 LAB — CBC WITH DIFFERENTIAL/PLATELET
Basophils Absolute: 0.1 10*3/uL (ref 0.0–0.2)
Basos: 1 %
EOS (ABSOLUTE): 0.2 10*3/uL (ref 0.0–0.4)
Eos: 3 %
Hematocrit: 47.3 % (ref 37.5–51.0)
Hemoglobin: 16.4 g/dL (ref 13.0–17.7)
Immature Grans (Abs): 0 10*3/uL (ref 0.0–0.1)
Immature Granulocytes: 0 %
Lymphocytes Absolute: 1.8 10*3/uL (ref 0.7–3.1)
Lymphs: 29 %
MCH: 31.6 pg (ref 26.6–33.0)
MCHC: 34.7 g/dL (ref 31.5–35.7)
MCV: 91 fL (ref 79–97)
Monocytes Absolute: 0.6 10*3/uL (ref 0.1–0.9)
Monocytes: 10 %
Neutrophils Absolute: 3.6 10*3/uL (ref 1.4–7.0)
Neutrophils: 57 %
Platelets: 229 10*3/uL (ref 150–450)
RBC: 5.19 x10E6/uL (ref 4.14–5.80)
RDW: 12.9 % (ref 11.6–15.4)
WBC: 6.3 10*3/uL (ref 3.4–10.8)

## 2019-12-27 LAB — PSA, TOTAL AND FREE
PSA, Free Pct: 45 %
PSA, Free: 0.09 ng/mL
Prostate Specific Ag, Serum: 0.2 ng/mL (ref 0.0–4.0)

## 2019-12-27 LAB — HIV ANTIBODY (ROUTINE TESTING W REFLEX): HIV Screen 4th Generation wRfx: NONREACTIVE

## 2020-01-04 ENCOUNTER — Telehealth: Payer: Self-pay | Admitting: *Deleted

## 2020-01-04 NOTE — Telephone Encounter (Signed)
-----   Message from Carylon Perches, [place] sent at 01/02/2020  9:19 AM EDT -----  ----- Message ----- From: Scot Jun, FNP Sent: 01/02/2020   6:38 AM EDT To: Carylon Perches, CMA  All labs are normal. Please notify patient of results

## 2020-01-04 NOTE — Telephone Encounter (Signed)
Medical Assistant used [employer] to contact patient.  Interpreter Name: Marcene Brawn Interpreter #: [phone] UTR to reach patient with no VM option. !!Please inform patient of lab results being normal!!

## 2023-02-09 ENCOUNTER — Inpatient Hospital Stay (HOSPITAL_BASED_OUTPATIENT_CLINIC_OR_DEPARTMENT_OTHER)
Admission: EM | Admit: 2023-02-09 | Discharge: 2023-02-12 | DRG: 392 | Disposition: A | Discharge status: Home / Self Care | Payer: 59 | Attending: Internal Medicine | Admitting: Internal Medicine

## 2023-02-09 ENCOUNTER — Other Ambulatory Visit: Payer: Self-pay

## 2023-02-09 ENCOUNTER — Emergency Department (HOSPITAL_BASED_OUTPATIENT_CLINIC_OR_DEPARTMENT_OTHER): Payer: 59

## 2023-02-09 ENCOUNTER — Encounter (HOSPITAL_BASED_OUTPATIENT_CLINIC_OR_DEPARTMENT_OTHER): Payer: Self-pay | Admitting: Emergency Medicine

## 2023-02-09 DIAGNOSIS — Z841 Family history of disorders of kidney and ureter: Secondary | ICD-10-CM

## 2023-02-09 DIAGNOSIS — E7800 Pure hypercholesterolemia, unspecified: Secondary | ICD-10-CM | POA: Diagnosis present

## 2023-02-09 DIAGNOSIS — R251 Tremor, unspecified: Secondary | ICD-10-CM | POA: Diagnosis present

## 2023-02-09 DIAGNOSIS — Z87442 Personal history of urinary calculi: Secondary | ICD-10-CM | POA: Diagnosis not present

## 2023-02-09 DIAGNOSIS — K5732 Diverticulitis of large intestine without perforation or abscess without bleeding: Secondary | ICD-10-CM | POA: Diagnosis present

## 2023-02-09 DIAGNOSIS — K5792 Diverticulitis of intestine, part unspecified, without perforation or abscess without bleeding: Secondary | ICD-10-CM | POA: Diagnosis present

## 2023-02-09 DIAGNOSIS — Z833 Family history of diabetes mellitus: Secondary | ICD-10-CM

## 2023-02-09 DIAGNOSIS — I10 Essential (primary) hypertension: Secondary | ICD-10-CM | POA: Diagnosis present

## 2023-02-09 DIAGNOSIS — K219 Gastro-esophageal reflux disease without esophagitis: Secondary | ICD-10-CM | POA: Diagnosis present

## 2023-02-09 DIAGNOSIS — Z800 Family history of malignant neoplasm of digestive organs: Secondary | ICD-10-CM | POA: Diagnosis not present

## 2023-02-09 DIAGNOSIS — Z888 Allergy status to other drugs, medicaments and biological substances status: Secondary | ICD-10-CM

## 2023-02-09 DIAGNOSIS — Z832 Family history of diseases of the blood and blood-forming organs and certain disorders involving the immune mechanism: Secondary | ICD-10-CM

## 2023-02-09 DIAGNOSIS — M1990 Unspecified osteoarthritis, unspecified site: Secondary | ICD-10-CM | POA: Diagnosis present

## 2023-02-09 DIAGNOSIS — K5720 Diverticulitis of large intestine with perforation and abscess without bleeding: Principal | ICD-10-CM | POA: Diagnosis present

## 2023-02-09 DIAGNOSIS — Z8249 Family history of ischemic heart disease and other diseases of the circulatory system: Secondary | ICD-10-CM | POA: Diagnosis not present

## 2023-02-09 DIAGNOSIS — K760 Fatty (change of) liver, not elsewhere classified: Secondary | ICD-10-CM | POA: Diagnosis present

## 2023-02-09 LAB — URINALYSIS, W/ REFLEX TO CULTURE (INFECTION SUSPECTED)
Bilirubin Urine: NEGATIVE
Glucose, UA: NEGATIVE mg/dL
Ketones, ur: 40 mg/dL — AB
Leukocytes,Ua: NEGATIVE
Nitrite: NEGATIVE
Protein, ur: 30 mg/dL — AB
Specific Gravity, Urine: 1.02 (ref 1.005–1.030)
pH: 8.5 — ABNORMAL HIGH (ref 5.0–8.0)

## 2023-02-09 LAB — COMPREHENSIVE METABOLIC PANEL WITH GFR
ALT: 28 U/L (ref 0–44)
AST: 51 U/L — ABNORMAL HIGH (ref 15–41)
Albumin: 3.7 g/dL (ref 3.5–5.0)
Alkaline Phosphatase: 52 U/L (ref 38–126)
Anion gap: 9 (ref 5–15)
BUN: 10 mg/dL (ref 6–20)
CO2: 29 mmol/L (ref 22–32)
Calcium: 8.4 mg/dL — ABNORMAL LOW (ref 8.9–10.3)
Chloride: 100 mmol/L (ref 98–111)
Creatinine, Ser: 1.11 mg/dL (ref 0.61–1.24)
GFR, Estimated: >60 mL/min
Glucose, Bld: 102 mg/dL — ABNORMAL HIGH (ref 70–99)
Potassium: 3.5 mmol/L (ref 3.5–5.1)
Sodium: 138 mmol/L (ref 135–145)
Total Bilirubin: 1.8 mg/dL — ABNORMAL HIGH (ref 0.3–1.2)
Total Protein: 7.1 g/dL (ref 6.5–8.1)

## 2023-02-09 LAB — CBC WITH DIFFERENTIAL/PLATELET
Abs Immature Granulocytes: 0.06 10*3/uL (ref 0.00–0.07)
Basophils Absolute: 0.1 10*3/uL (ref 0.0–0.1)
Basophils Relative: 0 %
Eosinophils Absolute: 0.1 10*3/uL (ref 0.0–0.5)
Eosinophils Relative: 0 %
HCT: 43.2 % (ref 39.0–52.0)
Hemoglobin: 14.9 g/dL (ref 13.0–17.0)
Immature Granulocytes: 0 %
Lymphocytes Relative: 12 %
Lymphs Abs: 1.8 10*3/uL (ref 0.7–4.0)
MCH: 31.2 pg (ref 26.0–34.0)
MCHC: 34.5 g/dL (ref 30.0–36.0)
MCV: 90.6 fL (ref 80.0–100.0)
Monocytes Absolute: 1.4 10*3/uL — ABNORMAL HIGH (ref 0.1–1.0)
Monocytes Relative: 9 %
Neutro Abs: 12 10*3/uL — ABNORMAL HIGH (ref 1.7–7.7)
Neutrophils Relative %: 79 %
Platelets: 206 10*3/uL (ref 150–400)
RBC: 4.77 MIL/uL (ref 4.22–5.81)
RDW: 12.7 % (ref 11.5–15.5)
WBC: 15.4 10*3/uL — ABNORMAL HIGH (ref 4.0–10.5)
nRBC: 0 % (ref 0.0–0.2)

## 2023-02-09 LAB — LIPASE, BLOOD: Lipase: 26 U/L (ref 11–51)

## 2023-02-09 MED ORDER — POLYETHYLENE GLYCOL 3350 17 G PO PACK: 17.0000 g | PACK | Freq: Every day | ORAL | Status: DC | PRN

## 2023-02-09 MED ORDER — OXYCODONE HCL 5 MG PO TABS
5.0000 mg | ORAL_TABLET | ORAL | Status: DC | PRN
  Administered 2023-02-09: 5 mg via ORAL
  Filled 2023-02-09: qty 1

## 2023-02-09 MED ORDER — METRONIDAZOLE 500 MG/100ML IV SOLN
500.0000 mg | Freq: Once | INTRAVENOUS | Status: DC
  Filled 2023-02-09: qty 100

## 2023-02-09 MED ORDER — BISACODYL 10 MG RE SUPP: 10.0000 mg | Freq: Every day | RECTAL | Status: DC | PRN

## 2023-02-09 MED ORDER — SODIUM CHLORIDE 0.9 % IV SOLN
2.0000 g | Freq: Once | INTRAVENOUS | Status: AC
  Administered 2023-02-09: 2 g via INTRAVENOUS
  Filled 2023-02-09: qty 20

## 2023-02-09 MED ORDER — SODIUM CHLORIDE 0.9 % IV BOLUS
1000.0000 mL | Freq: Once | INTRAVENOUS | Status: AC
  Administered 2023-02-09: 1000 mL via INTRAVENOUS

## 2023-02-09 MED ORDER — MAGNESIUM CITRATE PO SOLN: 1.0000 | Freq: Once | ORAL | Status: DC | PRN

## 2023-02-09 MED ORDER — ACETAMINOPHEN 325 MG PO TABS: 650.0000 mg | ORAL_TABLET | Freq: Four times a day (QID) | ORAL | Status: DC | PRN

## 2023-02-09 MED ORDER — ONDANSETRON HCL 4 MG PO TABS: 4.0000 mg | ORAL_TABLET | Freq: Four times a day (QID) | ORAL | Status: DC | PRN

## 2023-02-09 MED ORDER — ACETAMINOPHEN 650 MG RE SUPP: 650.0000 mg | Freq: Four times a day (QID) | RECTAL | Status: DC | PRN

## 2023-02-09 MED ORDER — METRONIDAZOLE 500 MG/100ML IV SOLN
500.0000 mg | Freq: Two times a day (BID) | INTRAVENOUS | Status: DC
  Administered 2023-02-09 – 2023-02-12 (×7): 500 mg via INTRAVENOUS
  Filled 2023-02-09 (×6): qty 100

## 2023-02-09 MED ORDER — HYDROMORPHONE HCL 1 MG/ML IJ SOLN: 0.5000 mg | INTRAMUSCULAR | Status: DC | PRN

## 2023-02-09 MED ORDER — ENOXAPARIN SODIUM 40 MG/0.4ML IJ SOSY
40.0000 mg | PREFILLED_SYRINGE | INTRAMUSCULAR | Status: DC
  Administered 2023-02-09 – 2023-02-11 (×3): 40 mg via SUBCUTANEOUS
  Filled 2023-02-09 (×3): qty 0.4

## 2023-02-09 MED ORDER — SODIUM CHLORIDE 0.9 % IV SOLN: INTRAVENOUS | Status: DC | PRN

## 2023-02-09 MED ORDER — ONDANSETRON HCL 4 MG/2ML IJ SOLN: 4.0000 mg | Freq: Four times a day (QID) | INTRAMUSCULAR | Status: DC | PRN

## 2023-02-09 NOTE — Assessment & Plan Note (Signed)
Present on CT abdomen and pelvis. Monitor.

## 2023-02-09 NOTE — ED Notes (Signed)
Bladder scan x 2 0 ml in bladder

## 2023-02-09 NOTE — Assessment & Plan Note (Signed)
Continue PPI ?

## 2023-02-09 NOTE — Progress Notes (Signed)
57 YO presents with Left Lower quadrant abdominal pain, nausea. Found to have diverticulitis with microperforation. Accepted to Med-surgery. Stable. Sx has been consulted and per ED MD will follow along.   James Dean.

## 2023-02-09 NOTE — ED Provider Notes (Signed)
[place] [facility] Provider Note   CSN: [id number] Arrival date & time: 02/09/23  1242     History  Chief Complaint  Patient presents with   Abdominal Pain    James Dean is a 57 y.o. male.  Patient is a 57 year old male who presents with pain in his left lower abdomen.  He said it started yesterday and was pretty intense at the time then went away and came back today again intense.  He says now it is eased off but still feels a little pain in his left lower abdomen.  It does go around to his back.  He denies any fevers.  No nausea or vomiting.  He denies any difficulty with urination.  He denies any associated testicular pain.  No change in his stools.  He felt like he may have to clean out his stool so he bought some over-the-counter medication which sounds like mag citrate yesterday and said he had a lot of watery stool after that but has not had any since then.  Does have a prior history of kidney stones.       Home Medications Prior to Admission medications   Medication Sig Start Date End Date Taking? Authorizing Provider  esomeprazole (NEXIUM) 20 MG capsule Take by mouth.    [provider]  Olopatadine HCl 0.2 % SOLN Place 1 drop into both eyes daily. 11/01/19   [provider]  tadalafil (CIALIS) 5 MG tablet Take by mouth. 12/13/19   [provider]      Allergies    Etodolac    Review of Systems   Review of Systems  Constitutional:  Negative for chills, diaphoresis, fatigue and fever.  HENT:  Negative for congestion, rhinorrhea and sneezing.   Eyes: Negative.   Respiratory:  Negative for cough, chest tightness and shortness of breath.   Cardiovascular:  Negative for chest pain and leg swelling.  Gastrointestinal:  Positive for abdominal pain. Negative for blood in stool, diarrhea, nausea and vomiting.  Genitourinary:  Negative for difficulty urinating, flank pain, frequency and hematuria.   Musculoskeletal:  Negative for arthralgias and back pain.  Skin:  Negative for rash.  Neurological:  Negative for dizziness, speech difficulty, weakness, numbness and headaches.    Physical Exam Updated Vital Signs BP 134/84   Pulse 66   Temp 98.3 F (36.8 C) (Oral)   Resp 16   Ht 5\' 8"  (1.727 m)   Wt 84.4 kg   SpO2 99%   BMI 28.28 kg/m  Physical Exam Constitutional:      Appearance: He is well-developed.  HENT:     Head: Normocephalic and atraumatic.  Eyes:     Pupils: Pupils are equal, round, and reactive to light.  Cardiovascular:     Rate and Rhythm: Normal rate and regular rhythm.     Heart sounds: Normal heart sounds.  Pulmonary:     Effort: Pulmonary effort is normal. No respiratory distress.     Breath sounds: Normal breath sounds. No wheezing or rales.  Chest:     Chest wall: No tenderness.  Abdominal:     General: Bowel sounds are normal.     Palpations: Abdomen is soft.     Tenderness: There is abdominal tenderness in the left lower quadrant. There is no guarding or rebound.  Musculoskeletal:        General: Normal range of motion.     Cervical back: Normal range of motion and neck supple.  Lymphadenopathy:     Cervical: No cervical adenopathy.  Skin:    General: Skin is warm and dry.     Findings: No rash.  Neurological:     Mental Status: He is alert and oriented to person, place, and time.     ED Results / Procedures / Treatments   Labs (all labs ordered are listed, but only abnormal results are displayed) Labs Reviewed  COMPREHENSIVE METABOLIC PANEL - Abnormal; Notable for the following components:      Result Value   Glucose, Bld 102 (*)    Calcium 8.4 (*)    AST 51 (*)    Total Bilirubin 1.8 (*)    All other components within normal limits  CBC WITH DIFFERENTIAL/PLATELET - Abnormal; Notable for the following components:   WBC 15.4 (*)    Neutro Abs 12.0 (*)    Monocytes Absolute 1.4 (*)    All other components within normal limits   LIPASE, BLOOD  URINALYSIS, W/ REFLEX TO CULTURE (INFECTION SUSPECTED)    EKG None  Radiology CT Renal Stone Study  Result Date: 02/09/2023 CLINICAL DATA:  Abdominal/flank pain, stone suspected EXAM: CT ABDOMEN AND PELVIS WITHOUT CONTRAST TECHNIQUE: Multidetector CT imaging of the abdomen and pelvis was performed following the standard protocol without IV contrast. RADIATION DOSE REDUCTION: This exam was performed according to the departmental dose-optimization program which includes automated exposure control, adjustment of the mA and/or kV according to patient size and/or use of iterative reconstruction technique. COMPARISON:  None Available. FINDINGS: Evaluation is limited by lack of IV contrast. Lower chest: No acute abnormality. Hepatobiliary: Status post cholecystectomy. Favor underlying hepatic steatosis. Pancreas: No peripancreatic fat stranding. Spleen: Unremarkable. Adrenals/Urinary Tract: Adrenal glands are unremarkable. No hydronephrosis. No obstructing nephrolithiasis. Bladder is decompressed. Stomach/Bowel: No evidence of bowel obstruction. There is circumferential wall thickening and exuberant adjacent fat stranding along the sigmoid colon. There are several diverticuli in this area. There are few adjacent tiny locules of air which are not definitively intraluminal in location (series 5, image 57; series 2, image 71; series 5, image 55, image 53). No focal drainable fluid collection. No large volume free air. Trace fluid. Appendix is normal. Vascular/Lymphatic: No significant vascular findings are present. No enlarged abdominal or pelvic lymph nodes. Reproductive: Prostate is unremarkable. Other: Tiny fat containing periumbilical hernia. Small fat containing RIGHT inguinal hernia. Musculoskeletal: No acute or significant osseous findings. IMPRESSION: 1. Acute sigmoid diverticulitis with few adjacent tiny locules of air which are not definitively intraluminal in location. This could reflect  microperforation. No focal drainable fluid collection. Recommend correlation with colon cancer screening history with consideration of follow-up colonoscopy after resolution of acute symptomatology if not up-to-date. 2. Favor underlying hepatic steatosis. Electronically Signed   By: Meda Klinefelter M.D.   On: 02/09/2023 14:29    Procedures Procedures    Medications Ordered in ED Medications  metroNIDAZOLE (FLAGYL) IVPB 500 mg (has no administration in time range)  sodium chloride 0.9 % bolus 1,000 mL (1,000 mLs Intravenous New Bag/Given 02/09/23 1355)  cefTRIAXone (ROCEPHIN) 2 g in sodium chloride 0.9 % 100 mL IVPB (2 g Intravenous New Bag/Given 02/09/23 1442)    ED Course/ Medical Decision Making/ A&P                             Medical Decision Making Amount and/or Complexity of Data Reviewed Labs: ordered. Radiology: ordered.  Risk Prescription drug management. Decision regarding hospitalization.   Patient is  a 57 year old male who presents with a 2-day history of pain in his left lower abdomen.  His labs showed elevation in his WBC count.  His creatinine is normal.  He has a mild elevation of his AST.  CT scan shows evidence of diverticulitis with some concerns for microperforation.  He was started on IV Rocephin and Flagyl.  I spoke with Barnetta Chapel who is the PA with this surgery team.  She states that the surgery team will follow the patient along but requested hospitalist admission.  I spoke with the hospitalist who has accepted the patient for admission.  Patient denies need for any pain medication at this point.  Final Clinical Impression(s) / ED Diagnoses Final diagnoses:  Diverticulitis    Rx / DC Orders ED Discharge Orders     None         Rolan Bucco, MD 02/09/23 1516

## 2023-02-09 NOTE — ED Triage Notes (Signed)
Pt sent to ED from [facility] for lower abd pain with decreased urination x 2 days, denies n/v/d

## 2023-02-09 NOTE — Assessment & Plan Note (Signed)
The patient presented with 2 days of left lower quadrant crampy abdominal pain. He denies fever or nausea/vomiting. He states that he did have some watery stool after he took some magnesium citrate in order to "clear his colon out". He presented with a WBC of 15.5. He did not have fever, hypotension, or elevated heart rate. CTA of the abdomen and pelvis demonstrated diverticulitis with possible micro-perforation. ED at [facility] spoke about the patient with general surgery. They stated that they would consult. The patient is receiving IV rocephin and Flagyl. Blood cultures x 2 are pending.

## 2023-02-09 NOTE — Assessment & Plan Note (Signed)
Bilirubin is increased to 1.8. Unsure of cause. Consider right upper quadrant ultrasound if not resolving in the morning.

## 2023-02-09 NOTE — H&P (Signed)
History and Physical    Patient: James Dean ZOX:[id number] DOB: [birth year] DOA: 02/09/2023 DOS: the patient was seen and examined on 02/09/2023 PCP: Cloward, Laban Emperor, MD  Patient coming from: Home  Chief Complaint:  Chief Complaint  Patient presents with   Abdominal Pain   HPI: James Dean is a 57 y.o. male with medical history significant of GERD, ED, hyperlipidemia, arthritis presented to [facility] with complaints of cramping abdominal pain x 2 days. He had tried to resolve it by drinking mag citrate which only resulted in watery stool and more cramping. CT abdomen and pelvis demonstrated sigmoid diveriticulitis with possible microperforation and hepatic steatosis. In the ED he was also found to have an elevated bilirubin of 1.8.   Blood cultures x 2 were ordered. He has been started on ceftriaxone and flagyl. General surgery was contacted by EDP at [facility]. They said that they would consult.   The patient denies fevers, chills, nausea, or vomiting. He has had some diarrhea primarily induced by the use of mag citrate. No hematemesis, no hematochezia, no melena, no coffee ground emesis. He also denies headache, visual changes, neurological changes, or difficulty walking. No rashes, no sores no lesions.  Review of Systems: As mentioned in the history of present illness. All other systems reviewed and are negative. Past Medical History:  Diagnosis Date   Arthritis    Diverticulosis    GERD (gastroesophageal reflux disease)    Hernia    High cholesterol    Hyperlipidemia    Past Surgical History:  Procedure Laterality Date   CHOLECYSTECTOMY     HERNIA REPAIR     Social History:  reports that he has never smoked. He has never used smokeless tobacco. He reports current alcohol use. He reports that he does not use drugs.  Allergies  Allergen Reactions   Etodolac Other (See Comments)    Family History  Problem Relation Age of Onset   Kidney disease Father    Diabetes Maternal  Aunt    Diabetes Maternal Uncle    Heart disease Paternal Aunt    Pancreatic cancer Paternal Grandmother    Liver cancer Paternal Grandmother    Irritable bowel syndrome Brother    Clotting disorder Maternal Grandmother     Prior to Admission medications   Medication Sig Start Date End Date Taking? Authorizing Provider  esomeprazole (NEXIUM) 20 MG capsule Take by mouth.    [provider]  Olopatadine HCl 0.2 % SOLN Place 1 drop into both eyes daily. 11/01/19   [provider]  tadalafil (CIALIS) 5 MG tablet Take by mouth. 12/13/19   [provider]    Physical Exam: Vitals:   02/09/23 1445 02/09/23 1515 02/09/23 1713 02/09/23 1826  BP:    (!) 158/84  Pulse: 63 75  89  Resp:    17  Temp:   98.6 F (37 C) 98.2 F (36.8 C)  TempSrc:   Oral Oral  SpO2: 100% 100%  97%  Weight:      Height:       Exam:  Constitutional:  The patient is awake, alert, and oriented x 3. No acute distress. Eyes:  pupils and irises appear normal Normal lids and conjunctivae ENMT:  grossly normal hearing  Lips appear normal external ears, nose appear normal Oropharynx: mucosa, tongue,posterior pharynx appear normal Neck:  neck appears normal, no masses, normal ROM, supple no thyromegaly Respiratory:  No increased work of breathing. No wheezes, rales, or rhonchi No tactile fremitus Cardiovascular:  Regular  rate and rhythm No murmurs, ectopy, or gallups. No lateral PMI. No thrills. Abdomen:  Abdomen is soft, slightly distended with left lower quadrant tenderness to palpation. No hernias, masses, or organomegaly Normoactive bowel sounds.  Musculoskeletal:  No cyanosis, clubbing, or edema Skin:  No rashes, lesions, ulcers palpation of skin: no induration or nodules Neurologic:  CN 2-12 intact Sensation all 4 extremities intact Psychiatric:  Mental status Mood, affect appropriate Orientation to person, place, time  judgment and insight appear intact Data  Reviewed:  CT abdomen and pelvis demonstrated sigmoid diverticulitis with possible micro-perforation as well as hepatic steatosis.  CBC    Component Value Date/Time   WBC 15.4 (H) 02/09/2023 1325   RBC 4.77 02/09/2023 1325   HGB 14.9 02/09/2023 1325   HGB 16.4 12/24/2019 0000   HCT 43.2 02/09/2023 1325   HCT 47.3 12/24/2019 0000   PLT 206 02/09/2023 1325   PLT 229 12/24/2019 0000   MCV 90.6 02/09/2023 1325   MCV 91 12/24/2019 0000   MCH 31.2 02/09/2023 1325   MCHC 34.5 02/09/2023 1325   RDW 12.7 02/09/2023 1325   RDW 12.9 12/24/2019 0000   LYMPHSABS 1.8 02/09/2023 1325   LYMPHSABS 1.8 12/24/2019 0000   MONOABS 1.4 (H) 02/09/2023 1325   EOSABS 0.1 02/09/2023 1325   EOSABS 0.2 12/24/2019 0000   BASOSABS 0.1 02/09/2023 1325   BASOSABS 0.1 12/24/2019 0000      Latest Ref Rng & Units 02/09/2023    1:25 PM 12/24/2019   12:00 AM 08/31/2007    1:26 PM  BMP  Glucose 70 - 99 mg/dL 188  94  86   BUN 6 - 20 mg/dL 10  12  6    Creatinine 0.61 - 1.24 mg/dL 4.16  6.06  3.01   BUN/Creat Ratio 9 - 20  12    Sodium 135 - 145 mmol/L 138  141  140   Potassium 3.5 - 5.1 mmol/L 3.5  4.4  4.3   Chloride 98 - 111 mmol/L 100  103  99   CO2 22 - 32 mmol/L 29  23  31    Calcium 8.9 - 10.3 mg/dL 8.4  9.2  60.1     Assessment and Plan: * Diverticulitis of rectosigmoid The patient presented with 2 days of left lower quadrant crampy abdominal pain. He denies fever or nausea/vomiting. He states that he did have some watery stool after he took some magnesium citrate in order to "clear his colon out". He presented with a WBC of 15.5. He did not have fever, hypotension, or elevated heart rate. CTA of the abdomen and pelvis demonstrated diverticulitis with possible micro-perforation. ED at [facility] spoke about the patient with general surgery. They stated that they would consult. The patient is receiving IV rocephin and Flagyl. Blood cultures x 2 are pending.  Hepatic steatosis Present on CT abdomen and pelvis.  Monitor.  Hyperbilirubinemia Bilirubin is increased to 1.8. Unsure of cause. Consider right upper quadrant ultrasound if not resolving in the morning.  GERD (gastroesophageal reflux disease) Continue PPI.      Advance Care Planning:   Code Status: Full Code   Consults: General surgery  Family Communication: None available  Severity of Illness: The appropriate patient status for this patient is INPATIENT. Inpatient status is judged to be reasonable and necessary in order to provide the required intensity of service to ensure the patient's safety. The patient's presenting symptoms, physical exam findings, and initial radiographic and laboratory data in the context of their chronic  comorbidities is felt to place them at high risk for further clinical deterioration. Furthermore, it is not anticipated that the patient will be medically stable for discharge from the hospital within 2 midnights of admission.   * I certify that at the point of admission it is my clinical judgment that the patient will require inpatient hospital care spanning beyond 2 midnights from the point of admission due to high intensity of service, high risk for further deterioration and high frequency of surveillance required.*  Author: Hudson Majkowski, DO 02/09/2023 10:47 PM  For on call review [email or web address].

## 2023-02-10 ENCOUNTER — Encounter (HOSPITAL_COMMUNITY): Payer: Self-pay | Admitting: Internal Medicine

## 2023-02-10 DIAGNOSIS — K5732 Diverticulitis of large intestine without perforation or abscess without bleeding: Secondary | ICD-10-CM | POA: Diagnosis not present

## 2023-02-10 LAB — CBC
HCT: 43.3 % (ref 39.0–52.0)
Hemoglobin: 14.8 g/dL (ref 13.0–17.0)
MCH: 31.4 pg (ref 26.0–34.0)
MCHC: 34.2 g/dL (ref 30.0–36.0)
MCV: 91.9 fL (ref 80.0–100.0)
Platelets: 218 10*3/uL (ref 150–400)
RBC: 4.71 MIL/uL (ref 4.22–5.81)
RDW: 12.8 % (ref 11.5–15.5)
WBC: 17.3 10*3/uL — ABNORMAL HIGH (ref 4.0–10.5)
nRBC: 0 % (ref 0.0–0.2)

## 2023-02-10 LAB — COMPREHENSIVE METABOLIC PANEL
ALT: 28 U/L (ref 0–44)
AST: 39 U/L (ref 15–41)
Albumin: 3.6 g/dL (ref 3.5–5.0)
Alkaline Phosphatase: 46 U/L (ref 38–126)
Anion gap: 11 (ref 5–15)
BUN: 10 mg/dL (ref 6–20)
CO2: 21 mmol/L — ABNORMAL LOW (ref 22–32)
Calcium: 8.3 mg/dL — ABNORMAL LOW (ref 8.9–10.3)
Chloride: 105 mmol/L (ref 98–111)
Creatinine, Ser: 1.07 mg/dL (ref 0.61–1.24)
GFR, Estimated: >60 mL/min (ref >60)
Glucose, Bld: 92 mg/dL (ref 70–99)
Potassium: 3.6 mmol/L (ref 3.5–5.1)
Sodium: 137 mmol/L (ref 135–145)
Total Bilirubin: 1.5 mg/dL — ABNORMAL HIGH (ref 0.3–1.2)
Total Protein: 7.1 g/dL (ref 6.5–8.1)

## 2023-02-10 LAB — HIV ANTIBODY (ROUTINE TESTING W REFLEX): HIV Screen 4th Generation wRfx: NONREACTIVE

## 2023-02-10 LAB — GLUCOSE, CAPILLARY: Glucose-Capillary: 88 mg/dL (ref 70–99)

## 2023-02-10 LAB — TROPONIN I (HIGH SENSITIVITY)
Troponin I (High Sensitivity): 7 ng/L (ref <18)
Troponin I (High Sensitivity): 9 ng/L (ref <18)

## 2023-02-10 MED ORDER — LACTATED RINGERS IV SOLN: INTRAVENOUS | Status: DC

## 2023-02-10 MED ORDER — SODIUM CHLORIDE 0.9 % IV SOLN
2.0000 g | INTRAVENOUS | Status: DC
  Administered 2023-02-10 – 2023-02-11 (×2): 2 g via INTRAVENOUS
  Filled 2023-02-10 (×2): qty 20

## 2023-02-10 MED ORDER — HYDRALAZINE HCL 20 MG/ML IJ SOLN: 10.0000 mg | Freq: Four times a day (QID) | INTRAMUSCULAR | Status: DC | PRN

## 2023-02-10 MED ORDER — LORAZEPAM 2 MG/ML IJ SOLN: 0.5000 mg | Freq: Four times a day (QID) | INTRAMUSCULAR | Status: DC | PRN

## 2023-02-10 MED ORDER — ACETAMINOPHEN 10 MG/ML IV SOLN
1000.0000 mg | Freq: Once | INTRAVENOUS | Status: AC
  Administered 2023-02-10: 1000 mg via INTRAVENOUS
  Filled 2023-02-10: qty 100

## 2023-02-10 MED ORDER — LORAZEPAM 2 MG/ML IJ SOLN: 1.0000 mg | Freq: Four times a day (QID) | INTRAMUSCULAR | Status: DC | PRN

## 2023-02-10 MED ORDER — METOPROLOL TARTRATE 5 MG/5ML IV SOLN
2.5000 mg | Freq: Four times a day (QID) | INTRAVENOUS | Status: DC | PRN
  Administered 2023-02-11: 2.5 mg via INTRAVENOUS
  Filled 2023-02-10: qty 5

## 2023-02-10 NOTE — TOC CM/SW Note (Signed)
Transition of Care [facility]) Screening Note  Patient Details  Name: James Dean Date of Birth: April 28, 1966  Transition of Care [facility]) CM/SW Contact:    Ewing Schlein, LCSW Phone Number: 02/10/2023, 9:52 AM  Transition of Care [facility]) has reviewed patient and no TOC needs have been identified at this time. We will continue to monitor patient advancement through interdisciplinary progression rounds. If new patient transition needs arise, please place a TOC consult.

## 2023-02-10 NOTE — Consult Note (Signed)
Consult Note  James Dean [birth year]  [id number].    Requesting MD: Dr. Fredderick Phenix Chief Complaint/Reason for Consult: diverticulitis  HPI:  57 y.o. male with medical history significant for arthritis, GERD, hyperlipidemia who presented to [facility] ED on 5/2 with abdominal pain. Pain began the day prior to presentation. He tried a laxative with result of watery stool. He denies nausea, vomiting, fever, chills.  He last had a colonoscopy 4 years ago with a 5 year recall in [place] so I don't have the report.  He has never had this before.    Work up in ED significant for WBC 15.5 and CT scan showing sigmoid diveriticulitis with possible microperforation.  Since admission he has been placed on abx therapy and is already feeling significantly improved.  Substance use: occ alcohol Blood thinners: none Past Surgeries: cholecystectomy, hernia repair   ROS: Reviewed and as above  Family History  Problem Relation Age of Onset   Kidney disease Father    Diabetes Maternal Aunt    Diabetes Maternal Uncle    Heart disease Paternal Aunt    Pancreatic cancer Paternal Grandmother    Liver cancer Paternal Grandmother    Irritable bowel syndrome Brother    Clotting disorder Maternal Grandmother     Past Medical History:  Diagnosis Date   Arthritis    Diverticulosis    GERD (gastroesophageal reflux disease)    Hernia    High cholesterol    Hyperlipidemia     Past Surgical History:  Procedure Laterality Date   CHOLECYSTECTOMY     HERNIA REPAIR      Social History:  reports that he has never smoked. He has never used smokeless tobacco. He reports current alcohol use. He reports that he does not use drugs.  Allergies:  Allergies  Allergen Reactions   Etodolac Other (See Comments)    Medications Prior to Admission  Medication Sig Dispense Refill   esomeprazole (NEXIUM) 20 MG capsule Take by mouth.     Olopatadine HCl 0.2 % SOLN Place 1 drop into both eyes  daily.     tadalafil (CIALIS) 5 MG tablet Take by mouth.      Blood pressure (!) 151/88, pulse 88, temperature 99 F (37.2 C), temperature source Oral, resp. rate 18, height 5\' 8"  (1.727 m), weight 84.4 kg, SpO2 98 %. Physical Exam: General: pleasant, WD, male who is laying in bed in NAD HEENT: head is normocephalic, atraumatic.  Sclera are noninjected.  Pupils equal and round. EOMs intact.  Ears and nose without any masses or lesions.  Mouth is pink and moist Heart: regular, rate, and rhythm.  Normal s1,s2. No obvious murmurs, gallops, or rubs noted.  Palpable radial and pedal pulses bilaterally Lungs: CTAB, no wheezes, rhonchi, or rales noted.  Respiratory effort nonlabored Abd: soft, essentially NT, but maybe slightly in suprapubic region, ND, +BS, no masses, hernias, or organomegaly Psych: A&Ox3 with an appropriate affect.    Results for orders placed or performed during the hospital encounter of 02/09/23 (from the past 48 hour(s))  Comprehensive metabolic panel     Status: Abnormal   Collection Time: 02/09/23  1:25 PM  Result Value Ref Range   Sodium 138 135 - 145 mmol/L   Potassium 3.5 3.5 - 5.1 mmol/L   Chloride 100 98 - 111 mmol/L   CO2 29 22 - 32 mmol/L   Glucose, Bld 102 (H) 70 - 99 mg/dL    Comment: Glucose reference range applies only  to samples taken after fasting for at least 8 hours.   BUN 10 6 - 20 mg/dL   Creatinine, Ser 5.40 0.61 - 1.24 mg/dL   Calcium 8.4 (L) 8.9 - 10.3 mg/dL   Total Protein 7.1 6.5 - 8.1 g/dL   Albumin 3.7 3.5 - 5.0 g/dL   AST 51 (H) 15 - 41 U/L   ALT 28 0 - 44 U/L   Alkaline Phosphatase 52 38 - 126 U/L   Total Bilirubin 1.8 (H) 0.3 - 1.2 mg/dL   GFR, Estimated >98 >11 mL/min    Comment: (NOTE) Calculated using the CKD-EPI Creatinine Equation (2021)    Anion gap 9 5 - 15    Comment: Performed at [facility], [address] [place], [place] [address]  Lipase, blood     Status: None   Collection Time: 02/09/23  1:25 PM  Result  Value Ref Range   Lipase 26 11 - 51 U/L    Comment: Performed at [facility], [address] [place], [place] [address]  CBC with Differential     Status: Abnormal   Collection Time: 02/09/23  1:25 PM  Result Value Ref Range   WBC 15.4 (H) 4.0 - 10.5 K/uL   RBC 4.77 4.22 - 5.81 MIL/uL   Hemoglobin 14.9 13.0 - 17.0 g/dL   HCT 13.0 86.5 - 78.4 %   MCV 90.6 80.0 - 100.0 fL   MCH 31.2 26.0 - 34.0 pg   MCHC 34.5 30.0 - 36.0 g/dL   RDW 69.6 29.5 - 28.4 %   Platelets 206 150 - 400 K/uL   nRBC 0.0 0.0 - 0.2 %   Neutrophils Relative % 79 %   Neutro Abs 12.0 (H) 1.7 - 7.7 K/uL   Lymphocytes Relative 12 %   Lymphs Abs 1.8 0.7 - 4.0 K/uL   Monocytes Relative 9 %   Monocytes Absolute 1.4 (H) 0.1 - 1.0 K/uL   Eosinophils Relative 0 %   Eosinophils Absolute 0.1 0.0 - 0.5 K/uL   Basophils Relative 0 %   Basophils Absolute 0.1 0.0 - 0.1 K/uL   Immature Granulocytes 0 %   Abs Immature Granulocytes 0.06 0.00 - 0.07 K/uL    Comment: Performed at [facility], [address] [place], [place] [address]  Urinalysis, w/ Reflex to Culture (Infection Suspected) -Urine, Clean Catch     Status: Abnormal   Collection Time: 02/09/23  1:30 PM  Result Value Ref Range   Specimen Source URINE, CLEAN CATCH    Color, Urine YELLOW YELLOW   APPearance CLEAR CLEAR   Specific Gravity, Urine 1.020 1.005 - 1.030   pH 8.5 (H) 5.0 - 8.0   Glucose, UA NEGATIVE NEGATIVE mg/dL   Hgb urine dipstick TRACE (A) NEGATIVE   Bilirubin Urine NEGATIVE NEGATIVE   Ketones, ur 40 (A) NEGATIVE mg/dL   Protein, ur 30 (A) NEGATIVE mg/dL   Nitrite NEGATIVE NEGATIVE   Leukocytes,Ua NEGATIVE NEGATIVE   Squamous Epithelial / HPF 0-5 0 - 5 /HPF   WBC, UA 0-5 0 - 5 WBC/hpf    Comment: Reflex urine culture not performed if WBC <=10, OR if Squamous epithelial cells >5. If Squamous epithelial cells >5, suggest recollection.   RBC / HPF 0-5 0 - 5 RBC/hpf   Bacteria, UA RARE (A) NONE SEEN    Comment: Performed at  [facility], [address] [place], [place] [address]  CBC  Status: Abnormal   Collection Time: 02/10/23 12:33 AM  Result Value Ref Range   WBC 17.3 (H) 4.0 - 10.5 K/uL   RBC 4.71 4.22 - 5.81 MIL/uL   Hemoglobin 14.8 13.0 - 17.0 g/dL   HCT 45.4 09.8 - 11.9 %   MCV 91.9 80.0 - 100.0 fL   MCH 31.4 26.0 - 34.0 pg   MCHC 34.2 30.0 - 36.0 g/dL   RDW 14.7 82.9 - 56.2 %   Platelets 218 150 - 400 K/uL   nRBC 0.0 0.0 - 0.2 %    Comment: Performed at [facility], [address] [place], [place] [address]  Comprehensive metabolic panel     Status: Abnormal   Collection Time: 02/10/23 12:34 AM  Result Value Ref Range   Sodium 137 135 - 145 mmol/L   Potassium 3.6 3.5 - 5.1 mmol/L   Chloride 105 98 - 111 mmol/L   CO2 21 (L) 22 - 32 mmol/L   Glucose, Bld 92 70 - 99 mg/dL    Comment: Glucose reference range applies only to samples taken after fasting for at least 8 hours.   BUN 10 6 - 20 mg/dL   Creatinine, Ser 5.78 0.61 - 1.24 mg/dL   Calcium 8.3 (L) 8.9 - 10.3 mg/dL   Total Protein 7.1 6.5 - 8.1 g/dL   Albumin 3.6 3.5 - 5.0 g/dL   AST 39 15 - 41 U/L   ALT 28 0 - 44 U/L   Alkaline Phosphatase 46 38 - 126 U/L   Total Bilirubin 1.5 (H) 0.3 - 1.2 mg/dL   GFR, Estimated >46 >96 mL/min    Comment: (NOTE) Calculated using the CKD-EPI Creatinine Equation (2021)    Anion gap 11 5 - 15    Comment: Performed at [facility], [address], [place], [place] [address]   CT Renal Stone Study  Result Date: 02/09/2023 CLINICAL DATA:  Abdominal/flank pain, stone suspected EXAM: CT ABDOMEN AND PELVIS WITHOUT CONTRAST TECHNIQUE: Multidetector CT imaging of the abdomen and pelvis was performed following the standard protocol without IV contrast. RADIATION DOSE REDUCTION: This exam was performed according to the departmental dose-optimization program which includes automated exposure control, adjustment of the mA and/or kV according to patient size  and/or use of iterative reconstruction technique. COMPARISON:  None Available. FINDINGS: Evaluation is limited by lack of IV contrast. Lower chest: No acute abnormality. Hepatobiliary: Status post cholecystectomy. Favor underlying hepatic steatosis. Pancreas: No peripancreatic fat stranding. Spleen: Unremarkable. Adrenals/Urinary Tract: Adrenal glands are unremarkable. No hydronephrosis. No obstructing nephrolithiasis. Bladder is decompressed. Stomach/Bowel: No evidence of bowel obstruction. There is circumferential wall thickening and exuberant adjacent fat stranding along the sigmoid colon. There are several diverticuli in this area. There are few adjacent tiny locules of air which are not definitively intraluminal in location (series 5, image 57; series 2, image 71; series 5, image 55, image 53). No focal drainable fluid collection. No large volume free air. Trace fluid. Appendix is normal. Vascular/Lymphatic: No significant vascular findings are present. No enlarged abdominal or pelvic lymph nodes. Reproductive: Prostate is unremarkable. Other: Tiny fat containing periumbilical hernia. Small fat containing RIGHT inguinal hernia. Musculoskeletal: No acute or significant osseous findings. IMPRESSION: 1. Acute sigmoid diverticulitis with few adjacent tiny locules of air which are not definitively intraluminal in location. This could reflect microperforation. No focal drainable fluid collection. Recommend correlation with colon cancer screening history with consideration of follow-up colonoscopy after resolution of acute symptomatology if not up-to-date. 2. Legrand Rams  underlying hepatic steatosis. Electronically Signed   By: Meda Klinefelter M.D.   On: 02/09/2023 14:29      Assessment/Plan Sigmoid diverticulitis with possible microperforation  Patient seen and examined and relevant labs and imaging reviewed. No current indication for emergency surgery.Continue IV antibiotics. Okay with CLD and likely soft diet  in the morning. Hopefully patient will improve with conservative treatment.  Discussed if he was to fail to improve may obtain follow up CT scan to rule out abscess. We discussed if he was to fail to improve with conservative management or was to acutely worsen he may need surgery during admission that would likely result in a colectomy and colostomy. If patient improves with conservative therapies would recommend colonoscopy in ~6 weeks.   We will follow with you    FEN: CLD, ADAT to soft in am ID: [email or web address] VTE: okay for chemical prophylaxis from surgical standpoint  I reviewed ED provider notes, hospitalist notes, last 24 h vitals and pain scores, last 48 h intake and output, last 24 h labs and trends, and last 24 h imaging results.   Letha Cape, [facility] 02/10/2023, 7:01 AM Please see Amion for pager number during day hours 7:00am-4:30pm

## 2023-02-10 NOTE — Progress Notes (Signed)
PROGRESS NOTE    James Dean  ZOX:[id number] DOB: Dec 03, 1965 DOA: 02/09/2023 PCP: Lavell Islam, MD   Brief Narrative: 57 year old with past medical history significant for GERD, ED, hyperlipidemia, arthritis presented to the ED complaining of abdominal pain that started today prior to admission.  He drank magnesium citrate without significant improvement and subsequently developed diarrhea.  Evaluation in the ED CT abdomen and pelvis demonstrated sigmoid diverticulitis with possible microperforation.  Patient admitted for diverticulitis with possible microperforation.   Assessment & Plan:   Principal Problem:   Diverticulitis of rectosigmoid Active Problems:   GERD (gastroesophageal reflux disease)   Hyperbilirubinemia   Hepatic steatosis   1-Diverticulitis, Microperforation;  -Presents with abdominal pain. CT abdomen pelvis: Acute sigmoid diverticulitis with few adjacent tiny locules of air which are not definitively intraluminal in location. This could reflect microperforation. No focal drainable fluid collection. Recommend correlation with colon cancer screening history with consideration of follow-up colonoscopy after resolution of acute symptomatology if not up-to-date. -Continue with IV ceftriaxone and Flagyl.   2-Chest pain;  BP elevated.  IV metoprolol; PRN and Hydralazine Order.  Cycle troponin  3-Hepatic asteatosis: Needs Weight loss.   4-Hyperbilirubinemia: Monitor  5-GERD: PPI  Tremors; had episode of tremors, chills. Likely related to fever. Rectal T 102.      Estimated body mass index is 28.28 kg/m as calculated from the following:   Height as of this encounter: 5\' 8"  (1.727 m).   Weight as of this encounter: 84.4 kg.   DVT prophylaxis: Lovenox Code Status: Full code Family Communication: Care discussed with patient.  Disposition Plan:  Status is: Inpatient Remains inpatient appropriate because: Management of Diverticulitis.      Consultants:  Surgery   Procedures:    Antimicrobials:    Subjective: Seen this am. Report abdominal pain improved.  He had colonoscopy 4 years ago/  Came to see him again: for Tremors, found to have fever. He was also complaining of transient chest pressure.   Objective: Vitals:   02/09/23 1713 02/09/23 1826 02/10/23 0014 02/10/23 0409  BP:  (!) 158/84 (!) 155/82 (!) 151/88  Pulse:  89 95 88  Resp:  17 18 18   Temp: 98.6 F (37 C) 98.2 F (36.8 C) 99.6 F (37.6 C) 99 F (37.2 C)  TempSrc: Oral Oral Oral Oral  SpO2:  97% 96% 98%  Weight:      Height:        Intake/Output Summary (Last 24 hours) at 02/10/2023 0750 Last data filed at 02/10/2023 8119 Gross per 24 hour  Intake 590.71 ml  Output --  Net 590.71 ml   Filed Weights   02/09/23 1258  Weight: 84.4 kg    Examination:  General exam: Appears calm and comfortable  Respiratory system: Clear to auscultation. Respiratory effort normal. Cardiovascular system: S1 & S2 heard, RRR. No JVD, murmurs, rubs, gallops or clicks. No pedal edema. Gastrointestinal system: Abdomen is nondistended, soft mild tender Lower quadrant Central nervous system: Alert and oriented. No focal neurological deficits. Extremities: Symmetric 5 x 5 power.   Data Reviewed: I have personally reviewed following labs and imaging studies  CBC: Recent Labs  Lab 02/09/23 1325 02/10/23 0033  WBC 15.4* 17.3*  NEUTROABS 12.0*  --   HGB 14.9 14.8  HCT 43.2 43.3  MCV 90.6 91.9  PLT 206 218   Basic Metabolic Panel: Recent Labs  Lab 02/09/23 1325 02/10/23 0034  NA 138 137  K 3.5 3.6  CL 100 105  CO2 29 21*  GLUCOSE 102* 92  BUN 10 10  CREATININE 1.11 1.07  CALCIUM 8.4* 8.3*   GFR: Estimated Creatinine Clearance: 80.6 mL/min (by C-G formula based on SCr of 1.07 mg/dL). Liver Function Tests: Recent Labs  Lab 02/09/23 1325 02/10/23 0034  AST 51* 39  ALT 28 28  ALKPHOS 52 46  BILITOT 1.8* 1.5*  PROT 7.1 7.1  ALBUMIN 3.7  3.6   Recent Labs  Lab 02/09/23 1325  LIPASE 26   No results for input(s): "AMMONIA" in the last 168 hours. Coagulation Profile: No results for input(s): "INR", "PROTIME" in the last 168 hours. Cardiac Enzymes: No results for input(s): "CKTOTAL", "CKMB", "CKMBINDEX", "TROPONINI" in the last 168 hours. BNP (last 3 results) No results for input(s): "PROBNP" in the last 8760 hours. HbA1C: No results for input(s): "HGBA1C" in the last 72 hours. CBG: No results for input(s): "GLUCAP" in the last 168 hours. Lipid Profile: No results for input(s): "CHOL", "HDL", "LDLCALC", "TRIG", "CHOLHDL", "LDLDIRECT" in the last 72 hours. Thyroid Function Tests: No results for input(s): "TSH", "T4TOTAL", "FREET4", "T3FREE", "THYROIDAB" in the last 72 hours. Anemia Panel: No results for input(s): "VITAMINB12", "FOLATE", "FERRITIN", "TIBC", "IRON", "RETICCTPCT" in the last 72 hours. Sepsis Labs: No results for input(s): "PROCALCITON", "LATICACIDVEN" in the last 168 hours.  No results found for this or any previous visit (from the past 240 hour(s)).       Radiology Studies: CT Renal Stone Study  Result Date: 02/09/2023 CLINICAL DATA:  Abdominal/flank pain, stone suspected EXAM: CT ABDOMEN AND PELVIS WITHOUT CONTRAST TECHNIQUE: Multidetector CT imaging of the abdomen and pelvis was performed following the standard protocol without IV contrast. RADIATION DOSE REDUCTION: This exam was performed according to the departmental dose-optimization program which includes automated exposure control, adjustment of the mA and/or kV according to patient size and/or use of iterative reconstruction technique. COMPARISON:  None Available. FINDINGS: Evaluation is limited by lack of IV contrast. Lower chest: No acute abnormality. Hepatobiliary: Status post cholecystectomy. Favor underlying hepatic steatosis. Pancreas: No peripancreatic fat stranding. Spleen: Unremarkable. Adrenals/Urinary Tract: Adrenal glands are  unremarkable. No hydronephrosis. No obstructing nephrolithiasis. Bladder is decompressed. Stomach/Bowel: No evidence of bowel obstruction. There is circumferential wall thickening and exuberant adjacent fat stranding along the sigmoid colon. There are several diverticuli in this area. There are few adjacent tiny locules of air which are not definitively intraluminal in location (series 5, image 57; series 2, image 71; series 5, image 55, image 53). No focal drainable fluid collection. No large volume free air. Trace fluid. Appendix is normal. Vascular/Lymphatic: No significant vascular findings are present. No enlarged abdominal or pelvic lymph nodes. Reproductive: Prostate is unremarkable. Other: Tiny fat containing periumbilical hernia. Small fat containing RIGHT inguinal hernia. Musculoskeletal: No acute or significant osseous findings. IMPRESSION: 1. Acute sigmoid diverticulitis with few adjacent tiny locules of air which are not definitively intraluminal in location. This could reflect microperforation. No focal drainable fluid collection. Recommend correlation with colon cancer screening history with consideration of follow-up colonoscopy after resolution of acute symptomatology if not up-to-date. 2. Favor underlying hepatic steatosis. Electronically Signed   By: Meda Klinefelter M.D.   On: 02/09/2023 14:29        Scheduled Meds:  enoxaparin (LOVENOX) injection  40 mg Subcutaneous Q24H   Continuous Infusions:  sodium chloride Stopped (02/09/23 1710)   cefTRIAXone (ROCEPHIN)  IV     metronidazole 500 mg (02/09/23 2215)     LOS: 1 day    Time spent: 35 minutes    Luis Nickles  Bonita Quin, MD [facility]   If 7PM-7AM, please contact night-coverage [email or web address]  02/10/2023, 7:50 AM

## 2023-02-10 NOTE — Significant Event (Signed)
Rapid Response Event Note   Reason for Call :  Chest pain and tremors  Initial Focused Assessment:  Patient in bed shaking, slightly diaphoretic. Per patient he got up to chair and chest pressure started then. Stated it was not pain, just tightness. MD paged prior to arrival. CBG 88 per bedside RN. EKG completed but some leads difficult to read due to severe tremors. Regalado MD at bedside and acknowledged EKG. Per MD EKG to be repeated later once tremors have subsided.   Rectal temp obtained- 102.38F. MD aware, orders for IV tylenol placed.    Interventions:  IV Tylenol, PRN Ativan,     Event Summary:   MD NotifiedSunnie Nielsen MD Call Time: 1225 Arrival Time: 1228 End Time: 1255  Rosaria Ferries, RN

## 2023-02-11 DIAGNOSIS — K5732 Diverticulitis of large intestine without perforation or abscess without bleeding: Secondary | ICD-10-CM | POA: Diagnosis not present

## 2023-02-11 LAB — BASIC METABOLIC PANEL
Anion gap: 11 (ref 5–15)
BUN: 10 mg/dL (ref 6–20)
CO2: 23 mmol/L (ref 22–32)
Calcium: 8.6 mg/dL — ABNORMAL LOW (ref 8.9–10.3)
Chloride: 102 mmol/L (ref 98–111)
Creatinine, Ser: 1.13 mg/dL (ref 0.61–1.24)
GFR, Estimated: >60 mL/min (ref >60)
Glucose, Bld: 104 mg/dL — ABNORMAL HIGH (ref 70–99)
Potassium: 3.7 mmol/L (ref 3.5–5.1)
Sodium: 136 mmol/L (ref 135–145)

## 2023-02-11 LAB — CBC
HCT: 43.9 % (ref 39.0–52.0)
Hemoglobin: 14.7 g/dL (ref 13.0–17.0)
MCH: 30.6 pg (ref 26.0–34.0)
MCHC: 33.5 g/dL (ref 30.0–36.0)
MCV: 91.5 fL (ref 80.0–100.0)
Platelets: 245 10*3/uL (ref 150–400)
RBC: 4.8 MIL/uL (ref 4.22–5.81)
RDW: 12.7 % (ref 11.5–15.5)
WBC: 12.2 10*3/uL — ABNORMAL HIGH (ref 4.0–10.5)
nRBC: 0 % (ref 0.0–0.2)

## 2023-02-11 NOTE — Progress Notes (Signed)
PROGRESS NOTE    James Dean  WUJ:[id number] DOB: Jul 22, 1966 DOA: 02/09/2023 PCP: Lavell Islam, MD   Brief Narrative: 57 year old with past medical history significant for GERD, ED, hyperlipidemia, arthritis presented to the ED complaining of abdominal pain that started today prior to admission.  He drank magnesium citrate without significant improvement and subsequently developed diarrhea.  Evaluation in the ED CT abdomen and pelvis demonstrated sigmoid diverticulitis with possible microperforation.  Patient admitted for diverticulitis with possible microperforation.   Assessment & Plan:   Principal Problem:   Diverticulitis of rectosigmoid Active Problems:   GERD (gastroesophageal reflux disease)   Hyperbilirubinemia   Hepatic steatosis   1-Diverticulitis, Microperforation;  -Presents with abdominal pain. CT abdomen pelvis: Acute sigmoid diverticulitis with few adjacent tiny locules of air which are not definitively intraluminal in location. This could reflect microperforation. No focal drainable fluid collection. Recommend correlation with colon cancer screening history with consideration of follow-up colonoscopy after resolution of acute symptomatology if not up-to-date. -Continue with IV ceftriaxone and Flagyl.  -pain improved today, diet advanced to soft today. He did had High fever yesterday noon. I will keep him overnight to make sure no more fever and he tolerates diet,.   2-Chest pain;  BP elevated.  PRN and Hydralazine Order.  Tropronin negative.   3-Hepatic asteatosis: Needs Weight loss.   4-Hyperbilirubinemia: Monitor  5-GERD: PPI  Tremors; had episode of tremors, chills. Likely related to fever. Rectal T 102. Resolved with tylenol.   Elevated BP; no prior history of HTN. Received low dose metoprolol this am. Will monitor to determine if he will need meds at discharge,.      Estimated body mass index is 28.28 kg/m as calculated from the following:    Height as of this encounter: 5\' 8"  (1.727 m).   Weight as of this encounter: 84.4 kg.   DVT prophylaxis: Lovenox Code Status: Full code Family Communication: Care discussed with patient.  Disposition Plan:  Status is: Inpatient Remains inpatient appropriate because: Management of Diverticulitis.     Consultants:  Surgery   Procedures:    Antimicrobials:    Subjective: He is feeling better, pain is better, is just abdominal distension. He was able to tolerates breakfast   Objective: Vitals:   02/10/23 1830 02/10/23 2243 02/11/23 0246 02/11/23 0653  BP: 124/80 129/77 (!) 141/79 (!) 167/81  Pulse: 68 66 74 87  Resp: 18 18 16 18   Temp: 98.2 F (36.8 C) 99.1 F (37.3 C) 99.4 F (37.4 C) 99.4 F (37.4 C)  TempSrc: Oral Oral Oral Oral  SpO2: 99% 99% 92% 99%  Weight:      Height:        Intake/Output Summary (Last 24 hours) at 02/11/2023 1347 Last data filed at 02/11/2023 9562 Gross per 24 hour  Intake 2140 ml  Output 450 ml  Net 1690 ml    Filed Weights   02/09/23 1258  Weight: 84.4 kg    Examination:  General exam: NAD Respiratory system:CTA Cardiovascular system: S 1 S 2 RRR Gastrointestinal system: BS present, soft, nt Central nervous system: alert Extremities: no edema   Data Reviewed: I have personally reviewed following labs and imaging studies  CBC: Recent Labs  Lab 02/09/23 1325 02/10/23 0033 02/11/23 0716  WBC 15.4* 17.3* 12.2*  NEUTROABS 12.0*  --   --   HGB 14.9 14.8 14.7  HCT 43.2 43.3 43.9  MCV 90.6 91.9 91.5  PLT 206 218 245    Basic Metabolic Panel: Recent Labs  Lab 02/09/23 1325 02/10/23 0034 02/11/23 0729  NA 138 137 136  K 3.5 3.6 3.7  CL 100 105 102  CO2 29 21* 23  GLUCOSE 102* 92 104*  BUN 10 10 10   CREATININE 1.11 1.07 1.13  CALCIUM 8.4* 8.3* 8.6*    GFR: Estimated Creatinine Clearance: 76.3 mL/min (by C-G formula based on SCr of 1.13 mg/dL). Liver Function Tests: Recent Labs  Lab 02/09/23 1325  02/10/23 0034  AST 51* 39  ALT 28 28  ALKPHOS 52 46  BILITOT 1.8* 1.5*  PROT 7.1 7.1  ALBUMIN 3.7 3.6    Recent Labs  Lab 02/09/23 1325  LIPASE 26    No results for input(s): "AMMONIA" in the last 168 hours. Coagulation Profile: No results for input(s): "INR", "PROTIME" in the last 168 hours. Cardiac Enzymes: No results for input(s): "CKTOTAL", "CKMB", "CKMBINDEX", "TROPONINI" in the last 168 hours. BNP (last 3 results) No results for input(s): "PROBNP" in the last 8760 hours. HbA1C: No results for input(s): "HGBA1C" in the last 72 hours. CBG: Recent Labs  Lab 02/10/23 1224  GLUCAP 88   Lipid Profile: No results for input(s): "CHOL", "HDL", "LDLCALC", "TRIG", "CHOLHDL", "LDLDIRECT" in the last 72 hours. Thyroid Function Tests: No results for input(s): "TSH", "T4TOTAL", "FREET4", "T3FREE", "THYROIDAB" in the last 72 hours. Anemia Panel: No results for input(s): "VITAMINB12", "FOLATE", "FERRITIN", "TIBC", "IRON", "RETICCTPCT" in the last 72 hours. Sepsis Labs: No results for input(s): "PROCALCITON", "LATICACIDVEN" in the last 168 hours.  No results found for this or any previous visit (from the past 240 hour(s)).       Radiology Studies: CT Renal Stone Study  Result Date: 02/09/2023 CLINICAL DATA:  Abdominal/flank pain, stone suspected EXAM: CT ABDOMEN AND PELVIS WITHOUT CONTRAST TECHNIQUE: Multidetector CT imaging of the abdomen and pelvis was performed following the standard protocol without IV contrast. RADIATION DOSE REDUCTION: This exam was performed according to the departmental dose-optimization program which includes automated exposure control, adjustment of the mA and/or kV according to patient size and/or use of iterative reconstruction technique. COMPARISON:  None Available. FINDINGS: Evaluation is limited by lack of IV contrast. Lower chest: No acute abnormality. Hepatobiliary: Status post cholecystectomy. Favor underlying hepatic steatosis. Pancreas: No  peripancreatic fat stranding. Spleen: Unremarkable. Adrenals/Urinary Tract: Adrenal glands are unremarkable. No hydronephrosis. No obstructing nephrolithiasis. Bladder is decompressed. Stomach/Bowel: No evidence of bowel obstruction. There is circumferential wall thickening and exuberant adjacent fat stranding along the sigmoid colon. There are several diverticuli in this area. There are few adjacent tiny locules of air which are not definitively intraluminal in location (series 5, image 57; series 2, image 71; series 5, image 55, image 53). No focal drainable fluid collection. No large volume free air. Trace fluid. Appendix is normal. Vascular/Lymphatic: No significant vascular findings are present. No enlarged abdominal or pelvic lymph nodes. Reproductive: Prostate is unremarkable. Other: Tiny fat containing periumbilical hernia. Small fat containing RIGHT inguinal hernia. Musculoskeletal: No acute or significant osseous findings. IMPRESSION: 1. Acute sigmoid diverticulitis with few adjacent tiny locules of air which are not definitively intraluminal in location. This could reflect microperforation. No focal drainable fluid collection. Recommend correlation with colon cancer screening history with consideration of follow-up colonoscopy after resolution of acute symptomatology if not up-to-date. 2. Favor underlying hepatic steatosis. Electronically Signed   By: Meda Klinefelter M.D.   On: 02/09/2023 14:29        Scheduled Meds:  enoxaparin (LOVENOX) injection  40 mg Subcutaneous Q24H   Continuous Infusions:  sodium chloride  Stopped (02/09/23 1710)   cefTRIAXone (ROCEPHIN)  IV 2 g (02/10/23 1544)   lactated ringers Stopped (02/11/23 1147)   metronidazole 500 mg (02/11/23 0916)     LOS: 2 days    Time spent: 35 minutes    Rushi Chasen A Janeisha Ryle, MD [facility]   If 7PM-7AM, please contact night-coverage [email or web address]  02/11/2023, 1:47 PM

## 2023-02-11 NOTE — Progress Notes (Signed)
Subjective/Chief Complaint: Much better, having flatus, tol diet, minimal pain   Objective: Vital signs in last 24 hours: Temp:  [97.8 F (36.6 C)-102.1 F (38.9 C)] 99.4 F (37.4 C) (05/04 0653) Pulse Rate:  [66-121] 87 (05/04 0653) Resp:  [16-18] 18 (05/04 0653) BP: (124-173)/(73-122) 167/81 (05/04 0653) SpO2:  [92 %-100 %] 99 % (05/04 0653) Last BM Date : 02/08/23  Intake/Output from previous day: 05/03 0701 - 05/04 0700 In: 2020 [P.O.:720; I.V.:1000; IV Piggyback:300] Out: 450 [Urine:450] Intake/Output this shift: No intake/output data recorded.  Ab minimal tenderness, soft nondistended  Lab Results:  Recent Labs    02/10/23 0033 02/11/23 0716  WBC 17.3* 12.2*  HGB 14.8 14.7  HCT 43.3 43.9  PLT 218 245   BMET Recent Labs    02/10/23 0034 02/11/23 0729  NA 137 136  K 3.6 3.7  CL 105 102  CO2 21* 23  GLUCOSE 92 104*  BUN 10 10  CREATININE 1.07 1.13  CALCIUM 8.3* 8.6*   PT/INR No results for input(s): "LABPROT", "INR" in the last 72 hours. ABG No results for input(s): "PHART", "HCO3" in the last 72 hours.  Invalid input(s): "PCO2", "PO2"  Studies/Results: CT Renal Stone Study  Result Date: 02/09/2023 CLINICAL DATA:  Abdominal/flank pain, stone suspected EXAM: CT ABDOMEN AND PELVIS WITHOUT CONTRAST TECHNIQUE: Multidetector CT imaging of the abdomen and pelvis was performed following the standard protocol without IV contrast. RADIATION DOSE REDUCTION: This exam was performed according to the departmental dose-optimization program which includes automated exposure control, adjustment of the mA and/or kV according to patient size and/or use of iterative reconstruction technique. COMPARISON:  None Available. FINDINGS: Evaluation is limited by lack of IV contrast. Lower chest: No acute abnormality. Hepatobiliary: Status post cholecystectomy. Favor underlying hepatic steatosis. Pancreas: No peripancreatic fat stranding. Spleen: Unremarkable. Adrenals/Urinary  Tract: Adrenal glands are unremarkable. No hydronephrosis. No obstructing nephrolithiasis. Bladder is decompressed. Stomach/Bowel: No evidence of bowel obstruction. There is circumferential wall thickening and exuberant adjacent fat stranding along the sigmoid colon. There are several diverticuli in this area. There are few adjacent tiny locules of air which are not definitively intraluminal in location (series 5, image 57; series 2, image 71; series 5, image 55, image 53). No focal drainable fluid collection. No large volume free air. Trace fluid. Appendix is normal. Vascular/Lymphatic: No significant vascular findings are present. No enlarged abdominal or pelvic lymph nodes. Reproductive: Prostate is unremarkable. Other: Tiny fat containing periumbilical hernia. Small fat containing RIGHT inguinal hernia. Musculoskeletal: No acute or significant osseous findings. IMPRESSION: 1. Acute sigmoid diverticulitis with few adjacent tiny locules of air which are not definitively intraluminal in location. This could reflect microperforation. No focal drainable fluid collection. Recommend correlation with colon cancer screening history with consideration of follow-up colonoscopy after resolution of acute symptomatology if not up-to-date. 2. Favor underlying hepatic steatosis. Electronically Signed   By: Meda Klinefelter M.D.   On: 02/09/2023 14:29    Anti-infectives: Anti-infectives (From admission, onward)    Start     Dose/Rate Route Frequency Ordered Stop   02/10/23 1500  cefTRIAXone (ROCEPHIN) 2 g in sodium chloride 0.9 % 100 mL IVPB        2 g 200 mL/hr over 30 Minutes Intravenous Every 24 hours 02/10/23 0647     02/09/23 1515  metroNIDAZOLE (FLAGYL) IVPB 500 mg        500 mg 100 mL/hr over 60 Minutes Intravenous Every 12 hours 02/09/23 1514     02/09/23 1445  cefTRIAXone (  ROCEPHIN) 2 g in sodium chloride 0.9 % 100 mL IVPB       See Hyperspace for full Linked Orders Report.   2 g 200 mL/hr over 30  Minutes Intravenous  Once 02/09/23 1435 02/09/23 1528   02/09/23 1445  metroNIDAZOLE (FLAGYL) IVPB 500 mg  Status:  Discontinued       See Hyperspace for full Linked Orders Report.   500 mg 100 mL/hr over 60 Minutes Intravenous  Once 02/09/23 1435 02/09/23 1514       Assessment/Plan: Sigmoid diverticulitis with possible microperforation   -soft diet -can dc home if tolerates on abx -needs GI follow up for csc -does not need surgery follow up  I reviewed hospitalist notes, last 24 h vitals and pain scores, last 48 h intake and output, last 24 h labs and trends, and last 24 h imaging results.     Emelia Loron 02/11/2023

## 2023-02-12 DIAGNOSIS — K5732 Diverticulitis of large intestine without perforation or abscess without bleeding: Secondary | ICD-10-CM | POA: Diagnosis not present

## 2023-02-12 MED ORDER — AMLODIPINE BESYLATE 5 MG PO TABS: 5.0000 mg | ORAL_TABLET | Freq: Every day | ORAL | 2 refills | Status: AC

## 2023-02-12 MED ORDER — AMOXICILLIN-POT CLAVULANATE 875-125 MG PO TABS: 1.0000 | ORAL_TABLET | Freq: Two times a day (BID) | ORAL | 0 refills | Status: AC

## 2023-02-12 MED ORDER — AMLODIPINE BESYLATE 5 MG PO TABS
5.0000 mg | ORAL_TABLET | Freq: Every day | ORAL | Status: DC
  Administered 2023-02-12: 5 mg via ORAL
  Filled 2023-02-12: qty 1

## 2023-02-12 NOTE — Progress Notes (Signed)
   Subjective/Chief Complaint: Tolerating soft diet No abdominal pain Several more bowel movements   Objective: Vital signs in last 24 hours: Temp:  [98.3 F (36.8 C)-98.6 F (37 C)] 98.3 F (36.8 C) (05/05 0345) Pulse Rate:  [62-79] 62 (05/05 0345) Resp:  [18] 18 (05/05 0345) BP: (123-152)/(81-96) 152/96 (05/05 0345) SpO2:  [97 %-99 %] 99 % (05/05 0345) Last BM Date : 02/11/23  Intake/Output from previous day: 05/04 0701 - 05/05 0700 In: 2220 [P.O.:720; I.V.:1200; IV Piggyback:300] Out: -  Intake/Output this shift: No intake/output data recorded.  General appearance: alert, cooperative, and no distress GI: soft, non-tender; bowel sounds normal; no masses,  no organomegaly  Lab Results:  Recent Labs    02/10/23 0033 02/11/23 0716  WBC 17.3* 12.2*  HGB 14.8 14.7  HCT 43.3 43.9  PLT 218 245   BMET Recent Labs    02/10/23 0034 02/11/23 0729  NA 137 136  K 3.6 3.7  CL 105 102  CO2 21* 23  GLUCOSE 92 104*  BUN 10 10  CREATININE 1.07 1.13  CALCIUM 8.3* 8.6*   PT/INR No results for input(s): "LABPROT", "INR" in the last 72 hours. ABG No results for input(s): "PHART", "HCO3" in the last 72 hours.  Invalid input(s): "PCO2", "PO2"  Studies/Results: No results found.  Anti-infectives: Anti-infectives (From admission, onward)    Start     Dose/Rate Route Frequency Ordered Stop   02/10/23 1500  cefTRIAXone (ROCEPHIN) 2 g in sodium chloride 0.9 % 100 mL IVPB        2 g 200 mL/hr over 30 Minutes Intravenous Every 24 hours 02/10/23 0647     02/09/23 1515  metroNIDAZOLE (FLAGYL) IVPB 500 mg        500 mg 100 mL/hr over 60 Minutes Intravenous Every 12 hours 02/09/23 1514     02/09/23 1445  cefTRIAXone (ROCEPHIN) 2 g in sodium chloride 0.9 % 100 mL IVPB       See Hyperspace for full Linked Orders Report.   2 g 200 mL/hr over 30 Minutes Intravenous  Once 02/09/23 1435 02/09/23 1528   02/09/23 1445  metroNIDAZOLE (FLAGYL) IVPB 500 mg  Status:  Discontinued        See Hyperspace for full Linked Orders Report.   500 mg 100 mL/hr over 60 Minutes Intravenous  Once 02/09/23 1435 02/09/23 1514       Assessment/Plan: Sigmoid diverticulitis with possible microperforation   -soft diet -can dc home -needs GI follow up for colonoscopy -does not need surgery follow up  LOS: 3 days    James Dean 02/12/2023

## 2023-02-12 NOTE — TOC Progression Note (Signed)
Transition of Care [facility]) - Progression Note    Patient Details  Name: James Dean MRN: [id number] Date of Birth: Apr 30, 1966  Transition of Care [facility]) CM/SW Contact  Adrian Prows, RN Phone Number: 02/12/2023, 1:47 PM  Clinical Narrative:    Notified pt wants new PCP; patient given list of [facility] PCPs; he will make his own appt; no TOC needs     Barriers to Discharge: No Barriers Identified  Expected Discharge Plan and Services         Expected Discharge Date: 02/12/23                                     Social Determinants of Health (SDOH) Interventions SDOH Screenings   Depression (PHQ2-9): Low Risk  (12/24/2019)  Tobacco Use: Low Risk  (02/10/2023)    Readmission Risk Interventions     No data to display

## 2023-02-12 NOTE — Progress Notes (Signed)
Reviewed written d/c instructions with pt and all questions answered. Pt verbalized understanding. Pt left in stable condition with all belongings.  

## 2023-02-12 NOTE — Discharge Instructions (Addendum)
Como diet baja en fibras por 3 semanas , hasta que se recupere de la diverticulitis.  Despues debe seguir [employer] [facility] en fibras.   Necesita una colonoscopy, debe seguir con su medico.   Tome antibioticos por 8 dias.   Tiene un medicamento nuevo para la pression alta.

## 2023-02-12 NOTE — Discharge Summary (Signed)
Physician Discharge Summary   Patient: James Dean MRN: [id number] DOB: Feb 01, 1966  Admit date:     02/09/2023  Discharge date: 02/12/23  Discharge Physician: Alba Cory   PCP: Lavell Islam, MD   Recommendations at discharge:    Needs to follow up with PCP for management of BP and resolution of diverticulitis.  Needs Colonoscopy   Discharge Diagnoses: Principal Problem:   Diverticulitis of rectosigmoid Active Problems:   GERD (gastroesophageal reflux disease)   Hyperbilirubinemia   Hepatic steatosis  Resolved Problems:   Diverticulitis  Hospital Course: 57 year old with past medical history significant for GERD, ED, hyperlipidemia, arthritis presented to the ED complaining of abdominal pain that started today prior to admission.  He drank magnesium citrate without significant improvement and subsequently developed diarrhea.  Evaluation in the ED CT abdomen and pelvis demonstrated sigmoid diverticulitis with possible microperforation.   Patient admitted for diverticulitis with possible microperforation.  Assessment and Plan: 1-Diverticulitis, Microperforation;  -Presents with abdominal pain. CT abdomen pelvis: Acute sigmoid diverticulitis with few adjacent tiny locules of air which are not definitively intraluminal in location. This could reflect microperforation. No focal drainable fluid collection. Recommend correlation with colon cancer screening history with consideration of follow-up colonoscopy after resolution of acute symptomatology if not up-to-date. -Treated  with IV ceftriaxone and Flagyl.  -Tolerating soft diet, denies worsening abdominal pain. Afebrile. Plan to discharge home on 58 days of Augmentin to complete 10 days antibiotics.    2-Chest pain;  BP elevated.  PRN and Hydralazine Order.  Tropronin negative.    3-Hepatic asteatosis: Needs Weight loss.    4-Hyperbilirubinemia: Monitor   5-GERD: PPI   Tremors; had episode of tremors, chills.  Likely related to fever. Rectal T 102. Resolved with tylenol.    HTN Elevated BP; Per family he has HO HTN. Stop taking med.s  He wil be discharge on Norvasc.           Consultants: General Sx Procedures performed: None Disposition: Home Diet recommendation:  Discharge Diet Orders (From admission, onward)     Start     Ordered   02/12/23 0000  Diet - low sodium heart healthy        02/12/23 1042           Cardiac diet DISCHARGE MEDICATION: Allergies as of 02/12/2023       Reactions   Etodolac Other (See Comments)        Medication List     TAKE these medications    amLODipine 5 MG tablet Commonly known as: NORVASC Take 1 tablet (5 mg total) by mouth daily.   amoxicillin-clavulanate 875-125 MG tablet Commonly known as: AUGMENTIN Take 1 tablet by mouth 2 (two) times daily for 8 days.   tadalafil 10 MG tablet Commonly known as: CIALIS Take 20 mg by mouth daily as needed for erectile dysfunction.        Follow-up Information     Cloward, Laban Emperor, MD Follow up in 1 week(s).   Specialty: Internal Medicine Contact information: [address] [place] [address] [phone]                Discharge Exam: Ceasar Mons Weights   02/09/23 1258  Weight: 84.4 kg   General nad  Condition at discharge: stable  The results of significant diagnostics from this hospitalization (including imaging, microbiology, ancillary and laboratory) are listed below for reference.   Imaging Studies: CT Renal Stone Study  Result Date: 02/09/2023 CLINICAL DATA:  Abdominal/flank pain, stone  suspected EXAM: CT ABDOMEN AND PELVIS WITHOUT CONTRAST TECHNIQUE: Multidetector CT imaging of the abdomen and pelvis was performed following the standard protocol without IV contrast. RADIATION DOSE REDUCTION: This exam was performed according to the departmental dose-optimization program which includes automated exposure control, adjustment of the mA and/or kV according  to patient size and/or use of iterative reconstruction technique. COMPARISON:  None Available. FINDINGS: Evaluation is limited by lack of IV contrast. Lower chest: No acute abnormality. Hepatobiliary: Status post cholecystectomy. Favor underlying hepatic steatosis. Pancreas: No peripancreatic fat stranding. Spleen: Unremarkable. Adrenals/Urinary Tract: Adrenal glands are unremarkable. No hydronephrosis. No obstructing nephrolithiasis. Bladder is decompressed. Stomach/Bowel: No evidence of bowel obstruction. There is circumferential wall thickening and exuberant adjacent fat stranding along the sigmoid colon. There are several diverticuli in this area. There are few adjacent tiny locules of air which are not definitively intraluminal in location (series 5, image 57; series 2, image 71; series 5, image 55, image 53). No focal drainable fluid collection. No large volume free air. Trace fluid. Appendix is normal. Vascular/Lymphatic: No significant vascular findings are present. No enlarged abdominal or pelvic lymph nodes. Reproductive: Prostate is unremarkable. Other: Tiny fat containing periumbilical hernia. Small fat containing RIGHT inguinal hernia. Musculoskeletal: No acute or significant osseous findings. IMPRESSION: 1. Acute sigmoid diverticulitis with few adjacent tiny locules of air which are not definitively intraluminal in location. This could reflect microperforation. No focal drainable fluid collection. Recommend correlation with colon cancer screening history with consideration of follow-up colonoscopy after resolution of acute symptomatology if not up-to-date. 2. Favor underlying hepatic steatosis. Electronically Signed   By: Meda Klinefelter M.D.   On: 02/09/2023 14:29    Microbiology: No results found for this or any previous visit.  Labs: CBC: Recent Labs  Lab 02/09/23 1325 02/10/23 0033 02/11/23 0716  WBC 15.4* 17.3* 12.2*  NEUTROABS 12.0*  --   --   HGB 14.9 14.8 14.7  HCT 43.2 43.3  43.9  MCV 90.6 91.9 91.5  PLT 206 218 245   Basic Metabolic Panel: Recent Labs  Lab 02/09/23 1325 02/10/23 0034 02/11/23 0729  NA 138 137 136  K 3.5 3.6 3.7  CL 100 105 102  CO2 29 21* 23  GLUCOSE 102* 92 104*  BUN 10 10 10   CREATININE 1.11 1.07 1.13  CALCIUM 8.4* 8.3* 8.6*   Liver Function Tests: Recent Labs  Lab 02/09/23 1325 02/10/23 0034  AST 51* 39  ALT 28 28  ALKPHOS 52 46  BILITOT 1.8* 1.5*  PROT 7.1 7.1  ALBUMIN 3.7 3.6   CBG: Recent Labs  Lab 02/10/23 1224  GLUCAP 88    Discharge time spent: greater than 30 minutes.  Signed: Alba Cory, MD [facility] 02/12/2023
